# Patient Record
Sex: Female | Born: 1940 | Race: White | Hispanic: No | Smoking: Former smoker
Health system: Southern US, Community
[De-identification: ages and names within clinical notes are randomized; demographics above are authoritative.]

## PROBLEM LIST (undated history)

## (undated) DIAGNOSIS — H6690 Otitis media, unspecified, unspecified ear: Secondary | ICD-10-CM

## (undated) DIAGNOSIS — E079 Disorder of thyroid, unspecified: Secondary | ICD-10-CM

## (undated) DIAGNOSIS — M797 Fibromyalgia: Secondary | ICD-10-CM

## (undated) DIAGNOSIS — J42 Unspecified chronic bronchitis: Secondary | ICD-10-CM

## (undated) DIAGNOSIS — F32A Depression, unspecified: Secondary | ICD-10-CM

## (undated) DIAGNOSIS — W19XXXA Unspecified fall, initial encounter: Secondary | ICD-10-CM

## (undated) DIAGNOSIS — S6291XA Unspecified fracture of right wrist and hand, initial encounter for closed fracture: Secondary | ICD-10-CM

## (undated) DIAGNOSIS — I10 Essential (primary) hypertension: Secondary | ICD-10-CM

## (undated) DIAGNOSIS — F329 Major depressive disorder, single episode, unspecified: Secondary | ICD-10-CM

## (undated) DIAGNOSIS — J45909 Unspecified asthma, uncomplicated: Secondary | ICD-10-CM

## (undated) DIAGNOSIS — R55 Syncope and collapse: Secondary | ICD-10-CM

## (undated) DIAGNOSIS — S62309A Unspecified fracture of unspecified metacarpal bone, initial encounter for closed fracture: Secondary | ICD-10-CM

## (undated) DIAGNOSIS — R42 Dizziness and giddiness: Secondary | ICD-10-CM

## (undated) DIAGNOSIS — R32 Unspecified urinary incontinence: Secondary | ICD-10-CM

## (undated) DIAGNOSIS — M8580 Other specified disorders of bone density and structure, unspecified site: Secondary | ICD-10-CM

## (undated) DIAGNOSIS — R519 Headache, unspecified: Secondary | ICD-10-CM

## (undated) DIAGNOSIS — F419 Anxiety disorder, unspecified: Secondary | ICD-10-CM

## (undated) DIAGNOSIS — E7800 Pure hypercholesterolemia, unspecified: Secondary | ICD-10-CM

## (undated) DIAGNOSIS — R51 Headache: Secondary | ICD-10-CM

## (undated) DIAGNOSIS — K219 Gastro-esophageal reflux disease without esophagitis: Secondary | ICD-10-CM

## (undated) DIAGNOSIS — J189 Pneumonia, unspecified organism: Secondary | ICD-10-CM

## (undated) HISTORY — DX: Other specified disorders of bone density and structure, unspecified site: M85.80

## (undated) HISTORY — PX: BUNIONECTOMY WITH HAMMERTOE RECONSTRUCTION: SHX5600

## (undated) HISTORY — PX: SHOULDER OPEN ROTATOR CUFF REPAIR: SHX2407

## (undated) HISTORY — DX: Dizziness and giddiness: R42

## (undated) HISTORY — DX: Unspecified urinary incontinence: R32

## (undated) HISTORY — DX: Major depressive disorder, single episode, unspecified: F32.9

## (undated) HISTORY — DX: Unspecified fracture of right wrist and hand, initial encounter for closed fracture: S62.91XA

## (undated) HISTORY — PX: TONSILLECTOMY: SUR1361

## (undated) HISTORY — DX: Essential (primary) hypertension: I10

## (undated) HISTORY — DX: Depression, unspecified: F32.A

## (undated) HISTORY — DX: Unspecified fall, initial encounter: W19.XXXA

---

## 1997-07-26 ENCOUNTER — Other Ambulatory Visit: Admission: RE | Admit: 1997-07-26 | Discharge: 1997-07-26 | Payer: Self-pay | Admitting: *Deleted

## 1998-11-20 ENCOUNTER — Other Ambulatory Visit: Admission: RE | Admit: 1998-11-20 | Discharge: 1998-11-20 | Payer: Self-pay | Admitting: *Deleted

## 2000-03-16 ENCOUNTER — Other Ambulatory Visit: Admission: RE | Admit: 2000-03-16 | Discharge: 2000-03-16 | Payer: Self-pay | Admitting: *Deleted

## 2000-09-17 ENCOUNTER — Encounter: Payer: Self-pay | Admitting: Family Medicine

## 2000-09-17 ENCOUNTER — Encounter: Admission: RE | Admit: 2000-09-17 | Discharge: 2000-09-17 | Payer: Self-pay | Admitting: Family Medicine

## 2000-09-28 ENCOUNTER — Encounter: Payer: Self-pay | Admitting: Orthopedic Surgery

## 2000-09-28 ENCOUNTER — Encounter: Admission: RE | Admit: 2000-09-28 | Discharge: 2000-09-28 | Payer: Self-pay | Admitting: Orthopedic Surgery

## 2000-10-09 ENCOUNTER — Encounter: Payer: Self-pay | Admitting: Orthopedic Surgery

## 2000-10-09 ENCOUNTER — Encounter: Admission: RE | Admit: 2000-10-09 | Discharge: 2000-10-09 | Payer: Self-pay | Admitting: Orthopedic Surgery

## 2000-10-28 ENCOUNTER — Other Ambulatory Visit: Admission: RE | Admit: 2000-10-28 | Discharge: 2000-10-28 | Payer: Self-pay | Admitting: *Deleted

## 2001-02-12 ENCOUNTER — Ambulatory Visit (HOSPITAL_BASED_OUTPATIENT_CLINIC_OR_DEPARTMENT_OTHER): Admission: RE | Admit: 2001-02-12 | Discharge: 2001-02-13 | Payer: Self-pay | Admitting: Orthopedic Surgery

## 2003-08-16 ENCOUNTER — Encounter: Admission: RE | Admit: 2003-08-16 | Discharge: 2003-08-16 | Payer: Self-pay | Admitting: Otolaryngology

## 2004-01-05 ENCOUNTER — Emergency Department (HOSPITAL_COMMUNITY): Admission: EM | Admit: 2004-01-05 | Discharge: 2004-01-05 | Payer: Self-pay | Admitting: Emergency Medicine

## 2006-07-10 ENCOUNTER — Encounter: Admission: RE | Admit: 2006-07-10 | Discharge: 2006-07-10 | Payer: Self-pay | Admitting: Family Medicine

## 2007-11-10 ENCOUNTER — Other Ambulatory Visit: Admission: RE | Admit: 2007-11-10 | Discharge: 2007-11-10 | Payer: Self-pay | Admitting: Family Medicine

## 2010-05-31 NOTE — Op Note (Signed)
[place]. [facility]  Patient:    Carolyn Hart, Carolyn Hart Visit Number: [id number] MRN: [id number]          Service Type: DSU Location: [facility] Attending Physician:  Teena Dunk Dictated by:   Sharlot Gowda., M.D. Proc. Date: 02/12/01 Admit Date:  02/12/2001                             Operative Report  INDICATIONS:  The patient with an MRI suggesting a partial rotator cuff tear with significant pain and weakness thought to be amenable to a possible overnight hospitalization.  PREOPERATIVE DIAGNOSIS:  Partial rotator cuff tear.  POSTOPERATIVE DIAGNOSIS:  Massive rotator cuff tear with atrophy and retraction of supraspinatus, impingement, acromioclavicular arthritis, torn labrum.  OPERATION: 1. Open rotator cuff repair, acromioplasty. 2. Open distal clavicle excision. 3. Open transposition of superior two-thirds of subscapularis.  SURGEON:  Marcie Mowers, Montez Hageman., M.D.  ASSISTANT:  ______  PROCEDURE:  Arthroscope through a posterior and anterior portal.  It was obvious on ______ indicated on the MRI.  Certainly, it had been several months since the scan.  There was degenerative tearing of the labrum.  Biceps tendon ______ was intact.  This was debrided.  There were no severe degenerative changes of the humeral head or glenoid.  There appeared to be a good band of tissue with a large interstitial tear with a gap.  An important fact in this relatively young patient, it was thought to make every attempt to repair would be in order.  For this reason, the procedure after debridement of the shoulder was converted to an open procedure with an incision bisecting the acromion and the [facility] joint.  The [facility] joint was moderately degenerated.  It was excised over about a cm.  Certainly given the patients small size, the acromion was very thick, and probably a cm of anterior acromion was resected. The CA ligament was released.  Important fact:  What  appeared to be a tuft of tissue on the greater tuberosity was bursa, and the patient had a massive retracted tear basically to the level of the glenoid.  Again with extensive mobilization of the infraspinatus and the supraspinatus, it should be noted that the supraspinatus really was not viable.  It was just a tuft of tissue that could be brought slightly towards the lateral aspect of the arm, estimated to be probably 3-4 cm from its normal attachment.  Two sutures could be placed in the apex of the split, so to speak, between the infraspinatus and supraspinatus; however, due to that severe atrophy of the supraspinatus, it was necessary to transpose the upper two-thirds of the subscapularis right off the lesser tuberosity and somewhat superiorly to try at least to create a buffer between the humeral head and the acromion.  Essentially, at least the head at that point could not be visualized due to the closure of the infraspinatus to the supraspinatus remnant, as well as the subscapularis.  The deltoid was meticulously repaired with #1 Tycron, the subcutaneous tissues with 2-0 Vicryl, and the skin with Monocryl.  Marcaine was infiltrated in the wound, and a light, compressive sterile dressing was applied. Dictated by:   Sharlot Gowda., M.D. Attending Physician:  Teena Dunk DD:  02/12/01 TD:  02/12/01 Job: [id number]

## 2010-06-04 ENCOUNTER — Inpatient Hospital Stay (INDEPENDENT_AMBULATORY_CARE_PROVIDER_SITE_OTHER)
Admission: RE | Admit: 2010-06-04 | Discharge: 2010-06-04 | Disposition: A | Discharge status: Home / Self Care | Payer: Medicare Other | Source: Ambulatory Visit | Attending: Family Medicine | Admitting: Family Medicine

## 2010-06-04 DIAGNOSIS — F411 Generalized anxiety disorder: Secondary | ICD-10-CM

## 2010-06-04 LAB — POCT I-STAT, CHEM 8
BUN: 12 mg/dL (ref 6–23)
Calcium, Ion: 1.12 mmol/L (ref 1.12–1.32)
Chloride: 102 mEq/L (ref 96–112)
Creatinine, Ser: 0.8 mg/dL (ref 0.4–1.2)
Glucose, Bld: 100 mg/dL — ABNORMAL HIGH (ref 70–99)
HCT: 42 % (ref 36.0–46.0)
Hemoglobin: 14.3 g/dL (ref 12.0–15.0)
Potassium: 3.8 mEq/L (ref 3.5–5.1)
Sodium: 139 mEq/L (ref 135–145)
TCO2: 28 mmol/L (ref 0–100)

## 2012-07-12 ENCOUNTER — Encounter: Payer: Self-pay | Admitting: Obstetrics and Gynecology

## 2012-07-12 ENCOUNTER — Ambulatory Visit (INDEPENDENT_AMBULATORY_CARE_PROVIDER_SITE_OTHER): Payer: Medicare Other | Admitting: Obstetrics and Gynecology

## 2012-07-12 VITALS — BP 130/72 | HR 70 | Ht 59.0 in | Wt 135.0 lb

## 2012-07-12 DIAGNOSIS — M899 Disorder of bone, unspecified: Secondary | ICD-10-CM

## 2012-07-12 DIAGNOSIS — M8580 Other specified disorders of bone density and structure, unspecified site: Secondary | ICD-10-CM

## 2012-07-12 DIAGNOSIS — Z01419 Encounter for gynecological examination (general) (routine) without abnormal findings: Secondary | ICD-10-CM

## 2012-07-12 MED ORDER — NYSTATIN 100000 UNIT/GM EX POWD: Freq: Three times a day (TID) | CUTANEOUS | Status: DC

## 2012-07-12 NOTE — Progress Notes (Signed)
Patient ID: Carolyn Hart, female   DOB: Sep 10, 1940, 72 y.o.   MRN: [id number] 72 y.o.   Married    Caucasian   female   [id number]   here for annual exam.   Has problems with "heat rashes" when exercises in the summer. No bleeding in the menopause.   No vaginal dryness problems.   Some urinary incontinence but not often. Can happen when delays to empty.   Will schedule herself for mammogram.    Patient's last menstrual period was 01/13/2002.          Sexually active: no  The current method of family planning is post menopausal status.  HRT:  Took for 10 years.  Stopped 15 years ago.   Exercising: walking Last mammogram:  06/2011 wnl: [facility] Last pap smear: 2011 wnl History of abnormal pap: no Smoking: no Alcohol: 1-2 glasses of wine per week Last colonoscopy: 2012 benign polyps with Dr. [facility]) next colonoscopy due 2017 Last Bone Density:  2013 with Dr. Buren Kos: osteopenia Servings of calcium and vitamin D - 3 - 4 servings.   Last tetanus shot: with PCP Last cholesterol check: with PCP  Hgb: PCP               Urine: PCP   Family History  Problem Relation Age of Onset  . Heart disease Mother   . Heart failure Mother   . Heart disease Father   . Heart failure Father   . Heart disease Brother   . Heart failure Brother   . Heart failure Brother     There are no active problems to display for this patient.   Past Medical History  Diagnosis Date  . Depression   . Hypertension   . Osteopenia   . Urinary incontinence     Past Surgical History  Procedure Laterality Date  . Rotator cuff repair Right     Allergies: Penicillins; Tetracyclines & related; and Codeine  Current Outpatient Prescriptions  Medication Sig Dispense Refill  . Evening Primrose Oil 500 MG CAPS Take 1 capsule by mouth at bedtime as needed.      . fish oil-omega-3 fatty acids 1000 MG capsule Take 2 g by mouth daily.      . Multiple Vitamin (MULTIVITAMIN) capsule Take 1 capsule by mouth  daily.      . nortriptyline (PAMELOR) 10 MG capsule Take 10 mg by mouth at bedtime.      . CRESTOR 10 MG tablet Take 1 tablet by mouth daily.      . fluticasone (FLONASE) 50 MCG/ACT nasal spray 1 spray as needed.      Marland Kitchen losartan (COZAAR) 50 MG tablet Take 1 tablet by mouth daily.      Marland Kitchen PATADAY 0.2 % SOLN Place 1 drop into both eyes daily.      . sertraline (ZOLOFT) 100 MG tablet Take 1 tablet by mouth daily.      Marland Kitchen ZOSTAVAX 40981 UNT/0.65ML injection        No current facility-administered medications for this visit.    ROS: Pertinent items are noted in HPI.  Social Hx:  Married.  Retired Runner, broadcasting/film/video.  Real estate agent.  Exam:    BP 130/72  Pulse 70  Ht 4\' 11"  (1.499 m)  Wt 135 lb (61.236 kg)  BMI 27.25 kg/m2  LMP 01/13/2002   Wt Readings from Last 3 Encounters:  07/12/12 135 lb (61.236 kg)     Ht Readings from Last 3 Encounters:  07/12/12  4\' 11"  (1.499 m)    General appearance: alert, cooperative and appears stated age Head: Normocephalic, without obvious abnormality, atraumatic Neck: no adenopathy, supple, symmetrical, trachea midline and thyroid not enlarged, symmetric, no tenderness/mass/nodules Lungs: clear to auscultation bilaterally Breasts: Inspection negative, No nipple retraction or dimpling, No nipple discharge or bleeding, No axillary or supraclavicular adenopathy, Normal to palpation without dominant masses Heart: regular rate and rhythm Abdomen: soft, non-tender; no masses,  no organomegaly Extremities: extremities normal, atraumatic, no cyanosis or edema Skin: Skin color, texture, turgor normal. No rashes or lesions Lymph nodes: Cervical, supraclavicular, and axillary nodes normal. No abnormal inguinal nodes palpated Neurologic: Grossly normal   Pelvic: External genitalia:  no lesions              Urethra:  normal appearing urethra with no masses, tenderness or lesions              Bartholins and Skenes: normal                 Vagina: normal appearing  vagina with normal color and discharge, no lesions.  Vaginal tissue with petechiae when has contact with speculum.              Cervix: normal appearance              Pap taken: no        Bimanual Exam:  Uterus:  uterus is normal size, shape, consistency and nontender                                      Adnexa: normal adnexa in size, nontender and no masses                                      Rectovaginal: Confirms                                      Anus:  normal sphincter tone, no lesions  A: normal menopausal exam History of candida of skin. Osteopenia.     P: mammogram recommended.  Patient will schedule. pap smear not needed as per guidelines I discussed ways to reduce risk for osteoporosis.  Diet, exercise, avoid tobacco, moderate ETOH use.  Next bone density 2015. Nystatin powder.  See Epic orders. return annually or prn     An After Visit Summary was printed and given to the patient.

## 2012-07-12 NOTE — Patient Instructions (Addendum)
EXERCISE AND DIET:  We recommended that you start or continue a regular exercise program for good health. Regular exercise means any activity that makes your heart beat faster and makes you sweat.  We recommend exercising at least 30 minutes per day at least 3 days a week, preferably 4 or 5.  We also recommend a diet low in fat and sugar.  Inactivity, poor dietary choices and obesity can cause diabetes, heart attack, stroke, and kidney damage, among others.    ALCOHOL AND SMOKING:  Women should limit their alcohol intake to no more than 7 drinks/beers/glasses of wine (combined, not each!) per week. Moderation of alcohol intake to this level decreases your risk of breast cancer and liver damage. And of course, no recreational drugs are part of a healthy lifestyle.  And absolutely no smoking or even second hand smoke. Most people know smoking can cause heart and lung diseases, but did you know it also contributes to weakening of your bones? Aging of your skin?  Yellowing of your teeth and nails?  CALCIUM AND VITAMIN D:  Adequate intake of calcium and Vitamin D are recommended.  The recommendations for exact amounts of these supplements seem to change often, but generally speaking 600 mg of calcium (either carbonate or citrate) and 800 units of Vitamin D per day seems prudent. Certain women may benefit from higher intake of Vitamin D.  If you are among these women, your doctor will have told you during your visit.    PAP SMEARS:  Pap smears, to check for cervical cancer or precancers,  have traditionally been done yearly, although recent scientific advances have shown that most women can have pap smears less often.  However, every woman still should have a physical exam from her gynecologist every year. It will include a breast check, inspection of the vulva and vagina to check for abnormal growths or skin changes, a visual exam of the cervix, and then an exam to evaluate the size and shape of the uterus and  ovaries.  And after 72 years of age, a rectal exam is indicated to check for rectal cancers. We will also provide age appropriate advice regarding health maintenance, like when you should have certain vaccines, screening for sexually transmitted diseases, bone density testing, colonoscopy, mammograms, etc.   MAMMOGRAMS:  All women over 40 years old should have a yearly mammogram. Many facilities now offer a "3D" mammogram, which may cost around $50 extra out of pocket. If possible,  we recommend you accept the option to have the 3D mammogram performed.  It both reduces the number of women who will be called back for extra views which then turn out to be normal, and it is better than the routine mammogram at detecting truly abnormal areas.    COLONOSCOPY:  Colonoscopy to screen for colon cancer is recommended for all women at age 50.  We know, you hate the idea of the prep.  We agree, BUT, having colon cancer and not knowing it is worse!!  Colon cancer so often starts as a polyp that can be seen and removed at colonscopy, which can quite literally save your life!  And if your first colonoscopy is normal and you have no family history of colon cancer, most women don't have to have it again for 10 years.  Once every ten years, you can do something that may end up saving your life, right?  We will be happy to help you get it scheduled when you are ready.    Be sure to check your insurance coverage so you understand how much it will cost.  It may be covered as a preventative service at no cost, but you should check your particular policy.    Osteoporosis Throughout your life, your body breaks down old bone and replaces it with new bone. As you get older, your body does not replace bone as quickly as it breaks it down. By the age of 30 years, most people begin to gradually lose bone because of the imbalance between bone loss and replacement. Some people lose more bone than others. Bone loss beyond a specified normal  degree is considered osteoporosis.  Osteoporosis affects the strength and durability of your bones. The inside of the ends of your bones and your flat bones, like the bones of your pelvis, look like honeycomb, filled with tiny open spaces. As bone loss occurs, your bones become less dense. This means that the open spaces inside your bones become bigger and the walls between these spaces become thinner. This makes your bones weaker. Bones of a person with osteoporosis can become so weak that they can break (fracture) during minor accidents, such as a simple fall. CAUSES  The following factors have been associated with the development of osteoporosis:  Smoking.  Drinking more than 2 alcoholic drinks several days per week.  Long-term use of certain medicines:  Corticosteroids.  Chemotherapy medicines.  Thyroid medicines.  Antiepileptic medicines.  Gonadal hormone suppression medicine.  Immunosuppression medicine.  Being underweight.  Lack of physical activity.  Lack of exposure to the sun. This can lead to vitamin D deficiency.  Certain medical conditions:  Certain inflammatory bowel diseases, such as Crohn's disease and ulcerative colitis.  Diabetes.  Hyperthyroidism.  Hyperparathyroidism. RISK FACTORS Anyone can develop osteoporosis. However, the following factors can increase your risk of developing osteoporosis:  Gender Women are at higher risk than men.  Age Being older than 50 years increases your risk.  Ethnicity White and Asian people have an increased risk.  Weight Being extremely underweight can increase your risk of osteoporosis.  Family history of osteoporosis Having a family member who has developed osteoporosis can increase your risk. SYMPTOMS  Usually, people with osteoporosis have no symptoms.  DIAGNOSIS  Signs during a physical exam that may prompt your caregiver to suspect osteoporosis include:  Decreased height. This is usually caused by the  compression of the bones that form your spine (vertebrae) because they have weakened and become fractured.  A curving or rounding of the upper back (kyphosis). To confirm signs of osteoporosis, your caregiver may request a procedure that uses 2 low-dose X-ray beams with different levels of energy to measure your bone mineral density (dual-energy X-ray absorptiometry [DXA]). Also, your caregiver may check your level of vitamin D. TREATMENT  The goal of osteoporosis treatment is to strengthen bones in order to decrease the risk of bone fractures. There are different types of medicines available to help achieve this goal. Some of these medicines work by slowing the processes of bone loss. Some medicines work by increasing bone density. Treatment also involves making sure that your levels of calcium and vitamin D are adequate. PREVENTION  There are things you can do to help prevent osteoporosis. Adequate intake of calcium and vitamin D can help you achieve optimal bone mineral density. Regular exercise can also help, especially resistance and weight-bearing activities. If you smoke, quitting smoking is an important part of osteoporosis prevention. MAKE SURE YOU:  Understand these instructions.  Will watch  your condition.  Will get help right away if you are not doing well or get worse. Document Released: 10/09/2004 Document Revised: 12/17/2011 Document Reviewed: 12/14/2010 [facility] Patient Information 2014 [place], [place].

## 2012-10-03 ENCOUNTER — Encounter (HOSPITAL_COMMUNITY): Payer: Self-pay | Admitting: Emergency Medicine

## 2012-10-03 ENCOUNTER — Emergency Department (HOSPITAL_COMMUNITY)
Admission: EM | Admit: 2012-10-03 | Discharge: 2012-10-03 | Disposition: A | Discharge status: Home / Self Care | Payer: Medicare Other | Attending: Emergency Medicine | Admitting: Emergency Medicine

## 2012-10-03 ENCOUNTER — Emergency Department (HOSPITAL_COMMUNITY): Payer: Medicare Other

## 2012-10-03 DIAGNOSIS — Z79899 Other long term (current) drug therapy: Secondary | ICD-10-CM | POA: Insufficient documentation

## 2012-10-03 DIAGNOSIS — F3289 Other specified depressive episodes: Secondary | ICD-10-CM | POA: Insufficient documentation

## 2012-10-03 DIAGNOSIS — Z880 Allergy status to penicillin: Secondary | ICD-10-CM | POA: Insufficient documentation

## 2012-10-03 DIAGNOSIS — IMO0002 Reserved for concepts with insufficient information to code with codable children: Secondary | ICD-10-CM | POA: Insufficient documentation

## 2012-10-03 DIAGNOSIS — Z87891 Personal history of nicotine dependence: Secondary | ICD-10-CM | POA: Insufficient documentation

## 2012-10-03 DIAGNOSIS — W11XXXA Fall on and from ladder, initial encounter: Secondary | ICD-10-CM | POA: Insufficient documentation

## 2012-10-03 DIAGNOSIS — Y939 Activity, unspecified: Secondary | ICD-10-CM | POA: Insufficient documentation

## 2012-10-03 DIAGNOSIS — W19XXXA Unspecified fall, initial encounter: Secondary | ICD-10-CM

## 2012-10-03 DIAGNOSIS — I10 Essential (primary) hypertension: Secondary | ICD-10-CM | POA: Insufficient documentation

## 2012-10-03 DIAGNOSIS — Y92009 Unspecified place in unspecified non-institutional (private) residence as the place of occurrence of the external cause: Secondary | ICD-10-CM | POA: Insufficient documentation

## 2012-10-03 DIAGNOSIS — S0990XA Unspecified injury of head, initial encounter: Secondary | ICD-10-CM | POA: Insufficient documentation

## 2012-10-03 DIAGNOSIS — F329 Major depressive disorder, single episode, unspecified: Secondary | ICD-10-CM | POA: Insufficient documentation

## 2012-10-03 NOTE — ED Notes (Signed)
Pt ambulated to restroom specimen in room

## 2012-10-03 NOTE — ED Provider Notes (Signed)
CSN: [id number]     Arrival date & time 10/03/12  1302 History   First MD Initiated Contact with Patient 10/03/12 1334     Chief Complaint  Patient presents with  . Fall   (Consider location/radiation/quality/duration/timing/severity/associated sxs/prior Treatment) HPI Pt presents after fall from a 6 foot ladder.  Pt states she lost her footing and fell from ladder.  This occurred over an hour prior to arrival.  She has some soreness in her right shoulder and hip.  Struck the back of her head and has small area of swelling.  No neck or back pain.  Has not had any treatment prior to arrival.  No LOC, no seizure activity, no vomiting.  She is not taking blood thinners. Denies dizziness, palpitations, syncope to be the cause of the fall.  There are no other associated systemic symptoms, there are no other alleviating or modifying factors.   Past Medical History  Diagnosis Date  . Depression   . Hypertension   . Osteopenia   . Urinary incontinence    Past Surgical History  Procedure Laterality Date  . Rotator cuff repair Right    Family History  Problem Relation Age of Onset  . Heart disease Mother   . Heart failure Mother   . Heart disease Father   . Heart failure Father   . Heart disease Brother   . Heart failure Brother   . Heart failure Brother    History  Substance Use Topics  . Smoking status: Former Smoker -- 0.25 packs/day    Types: Cigarettes    Quit date: 01/14/1988  . Smokeless tobacco: Never Used  . Alcohol Use: Yes     Comment: 1-2 glasses of wine per week   OB History   Grav Para Term Preterm Abortions TAB SAB Ect Mult Living   1 1 1       1      Review of Systems ROS reviewed and all otherwise negative except for mentioned in HPI  Allergies  Penicillins; Tetracyclines & related; and Codeine  Home Medications   Current Outpatient Rx  Name  Route  Sig  Dispense  Refill  . CRESTOR 10 MG tablet   Oral   Take 1 tablet by mouth daily.         .  Evening Primrose Oil 500 MG CAPS   Oral   Take 1 capsule by mouth at bedtime as needed (hormones).          . fish oil-omega-3 fatty acids 1000 MG capsule   Oral   Take 2 g by mouth daily.         . fluticasone (FLONASE) 50 MCG/ACT nasal spray      1 spray as needed.         Marland Kitchen losartan (COZAAR) 50 MG tablet   Oral   Take 1 tablet by mouth daily.         . nortriptyline (PAMELOR) 10 MG capsule   Oral   Take 10 mg by mouth 3 times/day as needed-between meals & bedtime (for sleep).          . Propylene Glycol (SYSTANE BALANCE) 0.6 % SOLN   Both Eyes   Place 1 drop into both eyes as needed (dry eyes).         . sertraline (ZOLOFT) 100 MG tablet   Oral   Take 1 tablet by mouth daily.         . cefdinir (OMNICEF) 300 MG capsule  Oral   Take 300 mg by mouth 2 (two) times daily. 7 days         . Multiple Vitamin (MULTIVITAMIN) capsule   Oral   Take 1 capsule by mouth daily.          BP 131/62  Pulse 83  Temp(Src) 98 F (36.7 C) (Oral)  Resp 18  SpO2 100%  LMP 01/13/2002 Vitals reviewed Physical Exam Physical Examination: General appearance - alert, well appearing, and in no distress Mental status - alert, oriented to person, place, and time Eyes -PERRL, EOMI Head- small contusion to right posterior occiput, no abrasion or bleeding Mouth - mucous membranes moist, pharynx normal without lesions Neck - supple, no significant adenopathy, no midline tenderness to palpation Chest - clear to auscultation, no wheezes, rales or rhonchi, symmetric air entry Heart - normal rate, regular rhythm, normal S1, S2, no murmurs, rubs, clicks or gallops Abdomen - soft, nontender, nondistended, no masses or organomegaly Back exam - no ttp in midline of c/t/l spine, no paraspinal tenderness Neurological - alert, oriented, normal speech, strength 5/5 in extremities x 4, sensation intact Extremities - mild diffuse ttp over right shoulder and right hip- no bony point  tenderness and only mild pain with ROM of these joints.  peripheral pulses normal, no pedal edema, no clubbing or cyanosis Skin - normal coloration and turgor, no rashes  ED Course  Procedures (including critical care time) Labs Review Labs Reviewed - No data to display Imaging Review No results found.  MDM   1. Head injury, initial encounter   2. Fall, initial encounter    Pt presenting with fall from ladder and resultant head injury.  No neck or back pain, low suspicion for fracture of shoulder or hip based on exam today.      Ethelda Chick, MD 10/06/12 1001

## 2012-10-03 NOTE — ED Notes (Signed)
Pt reports falling from a 6 foot ladder in her home 1.5 hours ago. Pt reports falling on her buttocks and then the back of her head. Pt has a small, dime sized hematoma to the posterior of her head. Pt denies LOC, however reports dizziness. Pt denies being on anticoagulant therapy. Pt reports falling from the top of the ladder. Pt ambulatory to treatment room without complication. Pt is A/O x4 and in NAD.

## 2013-02-12 ENCOUNTER — Emergency Department (HOSPITAL_COMMUNITY)
Admission: EM | Admit: 2013-02-12 | Discharge: 2013-02-12 | Disposition: A | Discharge status: Home / Self Care | Payer: Medicare Other | Attending: Emergency Medicine | Admitting: Emergency Medicine

## 2013-02-12 ENCOUNTER — Encounter (HOSPITAL_COMMUNITY): Payer: Self-pay | Admitting: Emergency Medicine

## 2013-02-12 DIAGNOSIS — R112 Nausea with vomiting, unspecified: Secondary | ICD-10-CM

## 2013-02-12 DIAGNOSIS — Z880 Allergy status to penicillin: Secondary | ICD-10-CM | POA: Insufficient documentation

## 2013-02-12 DIAGNOSIS — R51 Headache: Secondary | ICD-10-CM | POA: Insufficient documentation

## 2013-02-12 DIAGNOSIS — F329 Major depressive disorder, single episode, unspecified: Secondary | ICD-10-CM | POA: Insufficient documentation

## 2013-02-12 DIAGNOSIS — Z87891 Personal history of nicotine dependence: Secondary | ICD-10-CM | POA: Insufficient documentation

## 2013-02-12 DIAGNOSIS — I10 Essential (primary) hypertension: Secondary | ICD-10-CM | POA: Insufficient documentation

## 2013-02-12 DIAGNOSIS — Z8739 Personal history of other diseases of the musculoskeletal system and connective tissue: Secondary | ICD-10-CM | POA: Insufficient documentation

## 2013-02-12 DIAGNOSIS — F3289 Other specified depressive episodes: Secondary | ICD-10-CM | POA: Insufficient documentation

## 2013-02-12 DIAGNOSIS — Z79899 Other long term (current) drug therapy: Secondary | ICD-10-CM | POA: Insufficient documentation

## 2013-02-12 DIAGNOSIS — J329 Chronic sinusitis, unspecified: Secondary | ICD-10-CM

## 2013-02-12 DIAGNOSIS — R42 Dizziness and giddiness: Secondary | ICD-10-CM

## 2013-02-12 LAB — POCT I-STAT, CHEM 8
BUN: 20 mg/dL (ref 6–23)
Calcium, Ion: 1.19 mmol/L (ref 1.13–1.30)
Chloride: 102 mEq/L (ref 96–112)
Creatinine, Ser: 0.6 mg/dL (ref 0.50–1.10)
Glucose, Bld: 137 mg/dL — ABNORMAL HIGH (ref 70–99)
HCT: 39 % (ref 36.0–46.0)
Hemoglobin: 13.3 g/dL (ref 12.0–15.0)
Potassium: 3.6 mEq/L — ABNORMAL LOW (ref 3.7–5.3)
Sodium: 140 mEq/L (ref 137–147)
TCO2: 25 mmol/L (ref 0–100)

## 2013-02-12 LAB — GLUCOSE, CAPILLARY: GLUCOSE-CAPILLARY: 138 mg/dL — AB (ref 70–99)

## 2013-02-12 MED ORDER — ONDANSETRON 8 MG PO TBDP: 8.0000 mg | ORAL_TABLET | Freq: Two times a day (BID) | ORAL | Status: DC | PRN

## 2013-02-12 MED ORDER — AZITHROMYCIN 250 MG PO TABS: ORAL_TABLET | ORAL | Status: DC

## 2013-02-12 MED ORDER — MECLIZINE HCL 25 MG PO TABS: 25.0000 mg | ORAL_TABLET | Freq: Three times a day (TID) | ORAL | Status: DC | PRN

## 2013-02-12 MED ORDER — SODIUM CHLORIDE 0.9 % IV BOLUS (SEPSIS)
500.0000 mL | Freq: Once | INTRAVENOUS | Status: AC
Start: 2013-02-12 — End: 2013-02-12
  Administered 2013-02-12: 500 mL via INTRAVENOUS

## 2013-02-12 MED ORDER — DIAZEPAM 5 MG PO TABS
10.0000 mg | ORAL_TABLET | Freq: Once | ORAL | Status: AC
  Administered 2013-02-12: 10 mg via ORAL
  Filled 2013-02-12: qty 2

## 2013-02-12 MED ORDER — ONDANSETRON HCL 4 MG/2ML IJ SOLN
4.0000 mg | Freq: Once | INTRAMUSCULAR | Status: AC
  Administered 2013-02-12: 4 mg via INTRAVENOUS
  Filled 2013-02-12: qty 2

## 2013-02-12 MED ORDER — DIAZEPAM 5 MG PO TABS: 5.0000 mg | ORAL_TABLET | Freq: Three times a day (TID) | ORAL | Status: DC | PRN

## 2013-02-12 MED ORDER — MECLIZINE HCL 25 MG PO TABS
25.0000 mg | ORAL_TABLET | Freq: Once | ORAL | Status: AC
  Administered 2013-02-12: 25 mg via ORAL
  Filled 2013-02-12: qty 1

## 2013-02-12 NOTE — ED Notes (Signed)
Pt requesting update on discharge status, MD notified.

## 2013-02-12 NOTE — ED Notes (Signed)
Pt requesting for IV to be removed, IV taken out as requested.

## 2013-02-12 NOTE — Discharge Instructions (Signed)
Sinusitis Sinusitis is redness, soreness, and swelling (inflammation) of the paranasal sinuses. Paranasal sinuses are air pockets within the bones of your face (beneath the eyes, the middle of the forehead, or above the eyes). In healthy paranasal sinuses, mucus is able to drain out, and air is able to circulate through them by way of your nose. However, when your paranasal sinuses are inflamed, mucus and air can become trapped. This can allow bacteria and other germs to grow and cause infection. Sinusitis can develop quickly and last only a short time (acute) or continue over a long period (chronic). Sinusitis that lasts for more than 12 weeks is considered chronic.  CAUSES  Causes of sinusitis include:  Allergies.  Structural abnormalities, such as displacement of the cartilage that separates your nostrils (deviated septum), which can decrease the air flow through your nose and sinuses and affect sinus drainage.  Functional abnormalities, such as when the small hairs (cilia) that line your sinuses and help remove mucus do not work properly or are not present. SYMPTOMS  Symptoms of acute and chronic sinusitis are the same. The primary symptoms are pain and pressure around the affected sinuses. Other symptoms include:  Upper toothache.  Earache.  Headache.  Bad breath.  Decreased sense of smell and taste.  A cough, which worsens when you are lying flat.  Fatigue.  Fever.  Thick drainage from your nose, which often is green and may contain pus (purulent).  Swelling and warmth over the affected sinuses. DIAGNOSIS  Your caregiver will perform a physical exam. During the exam, your caregiver may:  Look in your nose for signs of abnormal growths in your nostrils (nasal polyps).  Tap over the affected sinus to check for signs of infection.  View the inside of your sinuses (endoscopy) with a special imaging device with a light attached (endoscope), which is inserted into your  sinuses. If your caregiver suspects that you have chronic sinusitis, one or more of the following tests may be recommended:  Allergy tests.  Nasal culture A sample of mucus is taken from your nose and sent to a lab and screened for bacteria.  Nasal cytology A sample of mucus is taken from your nose and examined by your caregiver to determine if your sinusitis is related to an allergy. TREATMENT  Most cases of acute sinusitis are related to a viral infection and will resolve on their own within 10 days. Sometimes medicines are prescribed to help relieve symptoms (pain medicine, decongestants, nasal steroid sprays, or saline sprays).  However, for sinusitis related to a bacterial infection, your caregiver will prescribe antibiotic medicines. These are medicines that will help kill the bacteria causing the infection.  Rarely, sinusitis is caused by a fungal infection. In theses cases, your caregiver will prescribe antifungal medicine. For some cases of chronic sinusitis, surgery is needed. Generally, these are cases in which sinusitis recurs more than 3 times per year, despite other treatments. HOME CARE INSTRUCTIONS   Drink plenty of water. Water helps thin the mucus so your sinuses can drain more easily.  Use a humidifier.  Inhale steam 3 to 4 times a day (for example, sit in the bathroom with the shower running).  Apply a warm, moist washcloth to your face 3 to 4 times a day, or as directed by your caregiver.  Use saline nasal sprays to help moisten and clean your sinuses.  Take over-the-counter or prescription medicines for pain, discomfort, or fever only as directed by your caregiver. Carolyn Hart  CARE IF:  You have increasing pain or severe headaches.  You have nausea, vomiting, or drowsiness.  You have swelling around your face.  You have vision problems.  You have a stiff neck.  You have difficulty breathing. MAKE SURE YOU:   Understand these  instructions.  Will watch your condition.  Will get help right away if you are not doing well or get worse. Document Released: 12/30/2004 Document Revised: 03/24/2011 Document Reviewed: 01/14/2011 [facility] Patient Information 2014 [place], [place].     Vertigo Vertigo means you feel like you or your surroundings are moving when they are not. Vertigo can be dangerous if it occurs when you are at work, driving, or performing difficult activities.  CAUSES  Vertigo occurs when there is a conflict of signals sent to your brain from the visual and sensory systems in your body. There are many different causes of vertigo, including:  Infections, especially in the inner ear.  A bad reaction to a drug or misuse of alcohol and medicines.  Withdrawal from drugs or alcohol.  Rapidly changing positions, such as lying down or rolling over in bed.  A migraine headache.  Decreased blood flow to the brain.  Increased pressure in the brain from a head injury, infection, tumor, or bleeding. SYMPTOMS  You may feel as though the world is spinning around or you are falling to the ground. Because your balance is upset, vertigo can cause nausea and vomiting. You may have involuntary eye movements (nystagmus). DIAGNOSIS  Vertigo is usually diagnosed by physical exam. If the cause of your vertigo is unknown, your caregiver may perform imaging tests, such as an MRI scan (magnetic resonance imaging). TREATMENT  Most cases of vertigo resolve on their own, without treatment. Depending on the cause, your caregiver may prescribe certain medicines. If your vertigo is related to body position issues, your caregiver may recommend movements or procedures to correct the problem. In rare cases, if your vertigo is caused by certain inner ear problems, you may need surgery. HOME CARE INSTRUCTIONS   Follow your caregiver's instructions.  Avoid driving.  Avoid operating heavy machinery.  Avoid performing any tasks  that would be dangerous to you or others during a vertigo episode.  Tell your caregiver if you notice that certain medicines seem to be causing your vertigo. Some of the medicines used to treat vertigo episodes can actually make them worse in some people. SEEK IMMEDIATE MEDICAL CARE IF:   Your medicines do not relieve your vertigo or are making it worse.  You develop problems with talking, walking, weakness, or using your arms, hands, or legs.  You develop severe headaches.  Your nausea or vomiting continues or gets worse.  You develop visual changes.  A family member notices behavioral changes.  Your condition gets worse. MAKE SURE YOU:  Understand these instructions.  Will watch your condition.  Will get help right away if you are not doing well or get worse. Document Released: 10/09/2004 Document Revised: 03/24/2011 Document Reviewed: 07/18/2010 [facility] Patient Information 2014 [place], [place].    Narcotic and benzodiazepine use may cause drowsiness, slowed breathing or dependence.  Please use with caution and do not drive, operate machinery or watch young children alone while taking them.  Taking combinations of these medications or drinking alcohol will potentiate these effects.

## 2013-02-12 NOTE — ED Notes (Signed)
Family at pt reports that pt went to bed last night with a frontal headache. Pt woke up at 0130 and could not focus her eyes. The room started spinning, then pt felt nausea with dry heaves. Pt denies being around others with illness. Pt last seen normal by family yesterday. Speech clear, grips equal, no facial droop.

## 2013-02-12 NOTE — ED Provider Notes (Signed)
CSN: [id number]     Arrival date & time 02/12/13  1191 History   First MD Initiated Contact with Patient 02/12/13 0818     Chief Complaint  Patient presents with  . Nausea  . Emesis  . Dizziness   (Consider location/radiation/quality/duration/timing/severity/associated sxs/prior Treatment) HPI Comments: Pt endorses head congestion and sinus congestion for several days, no fevers.  No prior h/o vertigo in the past.    Patient is a 73 y.o. female presenting with vomiting and dizziness. The history is provided by the patient and a relative.  Emesis Severity:  Moderate Duration:  3 hours Timing:  Constant Progression:  Unchanged Chronicity:  New Context: not post-tussive and not self-induced   Associated symptoms: headaches and URI   Associated symptoms: no abdominal pain, no chills and no fever   Dizziness Quality:  Room spinning, head spinning and vertigo Severity:  Moderate Onset quality:  Sudden Duration:  3 hours Timing:  Constant Progression:  Partially resolved Chronicity:  New Context: head movement   Context: not with loss of consciousness   Relieved by:  Lying down Worsened by:  Movement, sitting upright, turning head and standing up Associated symptoms: headaches, nausea and vomiting   Associated symptoms: no chest pain, no shortness of breath and no vision changes     Past Medical History  Diagnosis Date  . Depression   . Hypertension   . Osteopenia   . Urinary incontinence    Past Surgical History  Procedure Laterality Date  . Rotator cuff repair Right    Family History  Problem Relation Age of Onset  . Heart disease Mother   . Heart failure Mother   . Heart disease Father   . Heart failure Father   . Heart disease Brother   . Heart failure Brother   . Heart failure Brother    History  Substance Use Topics  . Smoking status: Former Smoker -- 0.25 packs/day    Types: Cigarettes    Quit date: 01/13/1973  . Smokeless tobacco: Never Used  .  Alcohol Use: Yes   OB History   Grav Para Term Preterm Abortions TAB SAB Ect Mult Living   1 1 1       1      Review of Systems  Constitutional: Negative for fever and chills.  HENT: Positive for congestion and sinus pressure.   Respiratory: Negative for cough and shortness of breath.   Cardiovascular: Negative for chest pain.  Gastrointestinal: Positive for nausea and vomiting. Negative for abdominal pain.  Neurological: Positive for dizziness and headaches.  All other systems reviewed and are negative.    Allergies  Penicillins; Tetracyclines & related; and Codeine  Home Medications   Current Outpatient Rx  Name  Route  Sig  Dispense  Refill  . CRESTOR 10 MG tablet   Oral   Take 5 mg by mouth daily.          . fluticasone (FLONASE) 50 MCG/ACT nasal spray   Each Nare   Place 1 spray into both nostrils as needed for allergies.          Marland Kitchen losartan (COZAAR) 50 MG tablet   Oral   Take 1 tablet by mouth daily.         . Multiple Vitamin (MULTIVITAMIN) capsule   Oral   Take 1 capsule by mouth daily.         Marland Kitchen Propylene Glycol (SYSTANE BALANCE) 0.6 % SOLN   Both Eyes   Place 1 drop  into both eyes as needed (dry eyes).         . pseudoephedrine-acetaminophen (TYLENOL SINUS) 30-500 MG TABS   Oral   Take 2 tablets by mouth every 6 (six) hours as needed (headache/sinus blockage).         . sertraline (ZOLOFT) 100 MG tablet   Oral   Take 1 tablet by mouth daily.         . diazepam (VALIUM) 5 MG tablet   Oral   Take 1 tablet (5 mg total) by mouth every 8 (eight) hours as needed for sedation (or severe dizziness).   15 tablet   0   . meclizine (ANTIVERT) 25 MG tablet   Oral   Take 1 tablet (25 mg total) by mouth 3 (three) times daily as needed for dizziness.   20 tablet   0   . nortriptyline (PAMELOR) 10 MG capsule   Oral   Take 10 mg by mouth 3 times/day as needed-between meals & bedtime (for sleep).         . ondansetron (ZOFRAN-ODT) 8 MG  disintegrating tablet   Oral   Take 1 tablet (8 mg total) by mouth every 12 (twelve) hours as needed for nausea.   20 tablet   0    BP 122/69  Pulse 78  Temp(Src) 97.6 F (36.4 C) (Oral)  Resp 20  Ht 5' (1.524 m)  Wt 123 lb (55.792 kg)  BMI 24.02 kg/m2  SpO2 96%  LMP 01/13/2002 Physical Exam  Nursing note and vitals reviewed. Constitutional: She is oriented to person, place, and time. She appears well-developed and well-nourished. No distress.  HENT:  Head: Normocephalic and atraumatic.  Right Ear: Tympanic membrane and ear canal normal.  Left Ear: Tympanic membrane and ear canal normal.  Nose: Sinus tenderness present. Right sinus exhibits maxillary sinus tenderness. Left sinus exhibits maxillary sinus tenderness.  Eyes: Conjunctivae are normal. Right eye exhibits nystagmus. Left eye exhibits nystagmus.  Horizontal nystagmus with far gaze to patient's left  Neck: Normal range of motion. Neck supple.  No tenderness,   Cardiovascular: Normal rate, regular rhythm and intact distal pulses.   No murmur heard. Pulmonary/Chest: Effort normal. No respiratory distress.  Abdominal: Soft. She exhibits no distension. There is no tenderness.  Neurological: She is alert and oriented to person, place, and time. No cranial nerve deficit. She exhibits normal muscle tone. Coordination normal.  Skin: Skin is warm and dry. She is not diaphoretic.    ED Course  Procedures (including critical care time) Labs Review Labs Reviewed  GLUCOSE, CAPILLARY - Abnormal; Notable for the following:    Glucose-Capillary 138 (*)    All other components within normal limits  POCT I-STAT, CHEM 8 - Abnormal; Notable for the following:    Potassium 3.6 (*)    Glucose, Bld 137 (*)    All other components within normal limits   Imaging Review No results found.  EKG Interpretation    Date/Time:  Saturday February 12 2013 07:56:57 EST Ventricular Rate:  79 PR Interval:  132 QRS Duration: 80 QT  Interval:  386 QTC Calculation: 442 R Axis:   55 Text Interpretation:  Normal sinus rhythm Normal ECG Normal ECG No previous tracing Confirmed by [facility]  MD, MICHEAL ([id number]) on 02/12/2013 12:03:17 PM           RA sat is 96% and I interpret to be adequate  MDM   1. Vertigo   2. Sinusitis   3. Nausea and vomiting  Pt with acute vertigo episode based on history and exam with nystagmus, sudden onset.  Pt with URI and sinus infection symptoms which may be trigger.  Pt improved here with Zofran, IVF and antivert.  With trying to stand and leave, symptoms came back somewhat, will give some valium as well.  Pt can follow up with PCP next week for continued symptoms.      Gavin PoundMichael Y. Courage Biglow, MD 02/12/13 [phone]

## 2013-07-04 ENCOUNTER — Encounter: Payer: Self-pay | Admitting: Obstetrics and Gynecology

## 2013-07-18 ENCOUNTER — Ambulatory Visit: Payer: Medicare Other | Admitting: Obstetrics and Gynecology

## 2013-07-21 ENCOUNTER — Ambulatory Visit: Payer: Medicare Other | Admitting: Gynecology

## 2013-08-17 ENCOUNTER — Ambulatory Visit (INDEPENDENT_AMBULATORY_CARE_PROVIDER_SITE_OTHER): Payer: Medicare Other | Admitting: Obstetrics and Gynecology

## 2013-08-17 ENCOUNTER — Encounter: Payer: Self-pay | Admitting: Obstetrics and Gynecology

## 2013-08-17 VITALS — BP 120/70 | HR 70 | Resp 14 | Ht 59.0 in | Wt 130.8 lb

## 2013-08-17 DIAGNOSIS — Z01419 Encounter for gynecological examination (general) (routine) without abnormal findings: Secondary | ICD-10-CM

## 2013-08-17 NOTE — Patient Instructions (Signed)

## 2013-08-17 NOTE — Progress Notes (Signed)
Patient ID: Carolyn Hart, female   DOB: 03/17/1940, 73 y.o.   MRN: [id number] GYNECOLOGY VISIT  PCP: Buren Kosouglas Shaw, MD  Referring provider:   HPI: 73 y.o.   Married  Caucasian  female   G1P1001 with Patient's last menstrual period was 01/13/2002.   here for   AEX.  Had episode of vertigo.   Spends time with her 4 grandchildren.  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  Is a former Editor, commissioningrench teacher.   Hgb:  PCP Urine: PCP  GYNECOLOGIC HISTORY: Patient's last menstrual period was 01/13/2002. Sexually active:  no Partner preference: female Contraception:  postmenopausal  Menopausal hormone therapy: no DES exposure:   no Blood transfusions:   no Sexually transmitted diseases:   no GYN procedures and prior surgeries:  none Last mammogram:   07/2013 XBJ:[facility]l:[facility]          Last pap and high risk HPV testing:  2011 wnl    History of abnormal pap smear:  no   OB History   Grav Para Term Preterm Abortions TAB SAB Ect Mult Living   1 1 1       1        LIFESTYLE: Exercise:    Walking and treadmill        Tobacco:    Former smoker Alcohol:     2 glasses of wine per week Drug use:  no  OTHER HEALTH MAINTENANCE: Tetanus/TDap:  Up to date with PCP Gardisil:             n/a Influenza:          10/2012 Zostavax:          Completed with PCP  Bone density:    06/2013 normal with Dr. Clelia CroftShaw Colonoscopy:    2012 had polyps at Dr. Luan MooreGanem's office [facility]  Next colonoscopy due 2017.  Cholesterol check: normal 06/2013 with PCP  Family History  Problem Relation Age of Onset  . Heart disease Mother   . Heart failure Mother   . Heart disease Father   . Heart failure Father   . Heart disease Brother   . Heart failure Brother   . Heart failure Brother     There are no active problems to display for this patient.  Past Medical History  Diagnosis Date  . Depression   . Hypertension   . Osteopenia   . Urinary incontinence   . Vertigo     Past Surgical History   Procedure Laterality Date  . Rotator cuff repair Right     ALLERGIES: Penicillins; Tetracyclines & related; and Codeine  Current Outpatient Prescriptions  Medication Sig Dispense Refill  . cetirizine (ZYRTEC) 10 MG tablet Take 10 mg by mouth daily.      Marland Kitchen. losartan (COZAAR) 25 MG tablet Take 25 mg by mouth daily.      . Multiple Vitamin (MULTIVITAMIN) tablet Take 1 tablet by mouth daily.      Marland Kitchen. Propylene Glycol (SYSTANE BALANCE) 0.6 % SOLN Apply 1 drop to eye daily.      . rosuvastatin (CRESTOR) 10 MG tablet Take 10 mg by mouth daily.      . sertraline (ZOLOFT) 100 MG tablet Take 100 mg by mouth daily.       No current facility-administered medications for this visit.     ROS:  Pertinent items are noted in HPI.  SOCIAL HISTORY:  Married.   PHYSICAL EXAMINATION:    BP 120/70  Pulse 70  Resp 14  Ht 4\' 11"  (1.499 m)  Wt 130 lb 12.8 oz (59.33 kg)  BMI 26.40 kg/m2  LMP 01/13/2002   Wt Readings from Last 3 Encounters:  08/17/13 130 lb 12.8 oz (59.33 kg)  02/12/13 123 lb (55.792 kg)  07/12/12 135 lb (61.236 kg)     Ht Readings from Last 3 Encounters:  08/17/13 4\' 11"  (1.499 m)  02/12/13 5' (1.524 m)  07/12/12 4\' 11"  (1.499 m)    General appearance: alert, cooperative and appears stated age Head: Normocephalic, without obvious abnormality, atraumatic Neck: no adenopathy, supple, symmetrical, trachea midline and thyroid not enlarged, symmetric, no tenderness/mass/nodules Lungs: clear to auscultation bilaterally Breasts: Inspection negative, No nipple retraction or dimpling, No nipple discharge or bleeding, No axillary or supraclavicular adenopathy, Normal to palpation without dominant masses Heart: regular rate and rhythm Abdomen: soft, non-tender; no masses,  no organomegaly Extremities: extremities normal, atraumatic, no cyanosis or edema Skin: Skin color, texture, turgor normal. No rashes or lesions Lymph nodes: Cervical, supraclavicular, and axillary nodes normal. No  abnormal inguinal nodes palpated Neurologic: Grossly normal  Pelvic: External genitalia:  no lesions              Urethra:  normal appearing urethra with no masses, tenderness or lesions              Bartholins and Skenes: normal                 Vagina: normal appearing vagina with normal color and discharge, no lesions              Cervix: normal appearance              Pap and high risk HPV testing done: No..            Bimanual Exam:  Uterus:  uterus is normal size, shape, consistency and nontender                                      Adnexa: normal adnexa in size, nontender and no masses                                      Rectovaginal: Confirms                                      Anus:  normal sphincter tone, no lesions  ASSESSMENT  Normal gynecologic exam. Osteopenia resolved per patient.   PLAN  Mammogram recommended yearly.  Pap smear and high risk HPV testing not indicated.  Counseled on self breast exam, Calcium and vitamin D intake, exercise. Will get mammogram and bone density reports. Return annually or prn   An After Visit Summary was printed and given to the patient.

## 2013-11-14 ENCOUNTER — Encounter: Payer: Self-pay | Admitting: Obstetrics and Gynecology

## 2014-08-30 ENCOUNTER — Encounter: Payer: Self-pay | Admitting: Obstetrics and Gynecology

## 2014-08-30 ENCOUNTER — Ambulatory Visit (INDEPENDENT_AMBULATORY_CARE_PROVIDER_SITE_OTHER): Payer: Medicare Other | Admitting: Obstetrics and Gynecology

## 2014-08-30 VITALS — BP 120/62 | HR 88 | Resp 16 | Ht 58.75 in | Wt 133.0 lb

## 2014-08-30 DIAGNOSIS — Z0000 Encounter for general adult medical examination without abnormal findings: Secondary | ICD-10-CM

## 2014-08-30 DIAGNOSIS — Z01419 Encounter for gynecological examination (general) (routine) without abnormal findings: Secondary | ICD-10-CM | POA: Diagnosis not present

## 2014-08-30 LAB — POCT URINALYSIS DIPSTICK
BILIRUBIN UA: NEGATIVE
Glucose, UA: NEGATIVE
Ketones, UA: NEGATIVE
Leukocytes, UA: NEGATIVE
NITRITE UA: NEGATIVE
PH UA: 6
PROTEIN UA: NEGATIVE
RBC UA: NEGATIVE
UROBILINOGEN UA: NEGATIVE

## 2014-08-30 NOTE — Patient Instructions (Signed)

## 2014-08-30 NOTE — Progress Notes (Signed)
74 y.o. G32P1001 Married Caucasian female here for annual exam.    Treating for bronchitis.  On Levaquin.   Husband with health issues - aortic aneurysm surgery, dementia, bipolar. Moving into a new home in [place] and will be living with family. Wants to build [place] on the property.  Works in Research officer, political party.  PCP:   Martha Clan  Patient's last menstrual period was 01/13/2002.          Sexually active: No.  The current method of family planning is post menopausal status.    Exercising: Yes.    Walking Smoker:  no  Health Maintenance: Pap:  2011 Normal History of abnormal Pap:  no MMG:  08/11/13 BIRADS1:neg.  Patient will schedule with [facility].  Colonoscopy:  2012 Polyps - every 5 years/ Dr. Bing Matter GI BMD:   06/2013  Result:  Normal  TDaP:  UPT - PCP Screening Labs:   PCP, Urine today: Clear   reports that she quit smoking about 41 years ago. Her smoking use included Cigarettes. She smoked 0.25 packs per day. She has never used smokeless tobacco. She reports that she drinks about 2.4 oz of alcohol per week. She reports that she does not use illicit drugs.  Past Medical History  Diagnosis Date  . Depression   . Hypertension   . Osteopenia   . Urinary incontinence   . Vertigo     Past Surgical History  Procedure Laterality Date  . Rotator cuff repair Right     Current Outpatient Prescriptions  Medication Sig Dispense Refill  . benzonatate (TESSALON) 100 MG capsule take 1 to 2 capsules by mouth every 8 hours if needed for cough  0  . cetirizine (ZYRTEC) 10 MG tablet Take 10 mg by mouth daily.    . cholecalciferol (VITAMIN D) 1000 UNITS tablet Take 1,000 Units by mouth daily.    Marland Kitchen levofloxacin (LEVAQUIN) 500 MG tablet take 1 tablet by mouth once daily with food  0  . losartan (COZAAR) 25 MG tablet Take 12.5 mg by mouth daily.     . Multiple Vitamin (MULTIVITAMIN) tablet Take 1 tablet by mouth daily.    Marland Kitchen Propylene Glycol (SYSTANE BALANCE) 0.6 % SOLN Apply 1  drop to eye daily.    . rosuvastatin (CRESTOR) 10 MG tablet Take 10 mg by mouth daily.    . sertraline (ZOLOFT) 100 MG tablet Take 100 mg by mouth daily.     No current facility-administered medications for this visit.    Family History  Problem Relation Age of Onset  . Heart disease Mother   . Heart failure Mother   . Heart disease Father   . Heart failure Father   . Heart disease Brother   . Heart failure Brother   . Heart failure Brother     ROS:  Pertinent items are noted in HPI.  Otherwise, a comprehensive ROS was negative.  Exam:   BP 120/62 mmHg  Pulse 88  Resp 16  Ht 4' 10.75" (1.492 m)  Wt 133 lb (60.328 kg)  BMI 27.10 kg/m2  LMP 01/13/2002    General appearance: alert, cooperative and appears stated age Head: Normocephalic, without obvious abnormality, atraumatic Neck: no adenopathy, supple, symmetrical, trachea midline and thyroid normal to inspection and palpation Lungs: clear to auscultation bilaterally Breasts: normal appearance, no masses or tenderness, Inspection negative, No nipple retraction or dimpling, No nipple discharge or bleeding, No axillary or supraclavicular adenopathy Heart: regular rate and rhythm Abdomen: soft, non-tender; bowel  sounds normal; no masses,  no organomegaly Extremities: extremities normal, atraumatic, no cyanosis or edema Skin: Skin color, texture, turgor normal. No rashes or lesions Lymph nodes: Cervical, supraclavicular, and axillary nodes normal. No abnormal inguinal nodes palpated Neurologic: Grossly normal  Pelvic: External genitalia:  no lesions              Urethra:  normal appearing urethra with no masses, tenderness or lesions              Bartholins and Skenes: normal                 Vagina: normal appearing vagina with normal color and discharge, no lesions              Cervix: no lesions              Pap taken: Yes.   Bimanual Exam:  Uterus:  normal size, contour, position, consistency, mobility, non-tender               Adnexa: normal adnexa and no mass, fullness, tenderness              Rectovaginal: Yes.  .  Confirms.              Anus:  normal sphincter tone, no lesions  Chaperone was present for exam.  Assessment:   Well woman visit with normal exam. Bronchitis.  Under treatment.   Plan: Yearly mammogram recommended after age 61.   Patient will call to schedule.  Recommended self breast exam.  Pap and HR HPV as above. Discussed Calcium, Vitamin D, regular exercise program including cardiovascular and weight bearing exercise. Labs performed.  No..     Refills given on medications.  No..    See PCP if cough symptoms do not resolve. Follow up annually and prn.      After visit summary provided.

## 2014-09-01 LAB — IPS PAP SMEAR ONLY

## 2014-09-06 ENCOUNTER — Other Ambulatory Visit: Payer: Self-pay | Admitting: Internal Medicine

## 2014-09-06 DIAGNOSIS — J329 Chronic sinusitis, unspecified: Secondary | ICD-10-CM

## 2014-09-08 ENCOUNTER — Ambulatory Visit
Admission: RE | Admit: 2014-09-08 | Discharge: 2014-09-08 | Disposition: A | Discharge status: Home / Self Care | Payer: Medicare Other | Source: Ambulatory Visit | Attending: Internal Medicine | Admitting: Internal Medicine

## 2014-09-08 DIAGNOSIS — J329 Chronic sinusitis, unspecified: Secondary | ICD-10-CM

## 2015-03-14 DIAGNOSIS — S6291XA Unspecified fracture of right wrist and hand, initial encounter for closed fracture: Secondary | ICD-10-CM

## 2015-03-14 HISTORY — DX: Unspecified fracture of right wrist and hand, initial encounter for closed fracture: S62.91XA

## 2015-04-09 ENCOUNTER — Inpatient Hospital Stay (HOSPITAL_COMMUNITY)
Admission: EM | Admit: 2015-04-09 | Discharge: 2015-04-11 | DRG: 312 | Disposition: A | Discharge status: Home / Self Care | Payer: Medicare Other | Attending: Internal Medicine | Admitting: Internal Medicine

## 2015-04-09 ENCOUNTER — Encounter (HOSPITAL_COMMUNITY): Payer: Self-pay | Admitting: Emergency Medicine

## 2015-04-09 ENCOUNTER — Emergency Department (HOSPITAL_COMMUNITY): Payer: Medicare Other

## 2015-04-09 ENCOUNTER — Observation Stay (HOSPITAL_COMMUNITY): Payer: Medicare Other

## 2015-04-09 DIAGNOSIS — E785 Hyperlipidemia, unspecified: Secondary | ICD-10-CM | POA: Insufficient documentation

## 2015-04-09 DIAGNOSIS — S62306A Unspecified fracture of fifth metacarpal bone, right hand, initial encounter for closed fracture: Secondary | ICD-10-CM | POA: Diagnosis present

## 2015-04-09 DIAGNOSIS — R55 Syncope and collapse: Principal | ICD-10-CM

## 2015-04-09 DIAGNOSIS — J329 Chronic sinusitis, unspecified: Secondary | ICD-10-CM | POA: Diagnosis present

## 2015-04-09 DIAGNOSIS — S62308A Unspecified fracture of other metacarpal bone, initial encounter for closed fracture: Secondary | ICD-10-CM | POA: Insufficient documentation

## 2015-04-09 DIAGNOSIS — I639 Cerebral infarction, unspecified: Secondary | ICD-10-CM

## 2015-04-09 DIAGNOSIS — E7800 Pure hypercholesterolemia, unspecified: Secondary | ICD-10-CM | POA: Diagnosis present

## 2015-04-09 DIAGNOSIS — R569 Unspecified convulsions: Secondary | ICD-10-CM | POA: Diagnosis present

## 2015-04-09 DIAGNOSIS — F329 Major depressive disorder, single episode, unspecified: Secondary | ICD-10-CM | POA: Diagnosis present

## 2015-04-09 DIAGNOSIS — M797 Fibromyalgia: Secondary | ICD-10-CM | POA: Diagnosis present

## 2015-04-09 DIAGNOSIS — R42 Dizziness and giddiness: Secondary | ICD-10-CM

## 2015-04-09 DIAGNOSIS — Z87891 Personal history of nicotine dependence: Secondary | ICD-10-CM

## 2015-04-09 DIAGNOSIS — Y92481 Parking lot as the place of occurrence of the external cause: Secondary | ICD-10-CM

## 2015-04-09 DIAGNOSIS — Z79899 Other long term (current) drug therapy: Secondary | ICD-10-CM

## 2015-04-09 DIAGNOSIS — I10 Essential (primary) hypertension: Secondary | ICD-10-CM | POA: Diagnosis not present

## 2015-04-09 DIAGNOSIS — W19XXXA Unspecified fall, initial encounter: Secondary | ICD-10-CM | POA: Diagnosis not present

## 2015-04-09 DIAGNOSIS — F32A Depression, unspecified: Secondary | ICD-10-CM

## 2015-04-09 DIAGNOSIS — S0003XA Contusion of scalp, initial encounter: Secondary | ICD-10-CM | POA: Diagnosis present

## 2015-04-09 DIAGNOSIS — H6690 Otitis media, unspecified, unspecified ear: Secondary | ICD-10-CM

## 2015-04-09 DIAGNOSIS — E860 Dehydration: Secondary | ICD-10-CM | POA: Diagnosis present

## 2015-04-09 DIAGNOSIS — S62309A Unspecified fracture of unspecified metacarpal bone, initial encounter for closed fracture: Secondary | ICD-10-CM | POA: Diagnosis present

## 2015-04-09 HISTORY — DX: Pure hypercholesterolemia, unspecified: E78.00

## 2015-04-09 HISTORY — DX: Pneumonia, unspecified organism: J18.9

## 2015-04-09 HISTORY — DX: Unspecified chronic bronchitis: J42

## 2015-04-09 HISTORY — DX: Headache: R51

## 2015-04-09 HISTORY — DX: Fibromyalgia: M79.7

## 2015-04-09 HISTORY — DX: Syncope and collapse: R55

## 2015-04-09 HISTORY — DX: Anxiety disorder, unspecified: F41.9

## 2015-04-09 HISTORY — DX: Unspecified fracture of unspecified metacarpal bone, initial encounter for closed fracture: S62.309A

## 2015-04-09 HISTORY — DX: Unspecified fall, initial encounter: W19.XXXA

## 2015-04-09 HISTORY — DX: Headache, unspecified: R51.9

## 2015-04-09 HISTORY — DX: Otitis media, unspecified, unspecified ear: H66.90

## 2015-04-09 LAB — CBC WITH DIFFERENTIAL/PLATELET
Basophils Absolute: 0 K/uL (ref 0.0–0.1)
Basophils Relative: 0 %
Eosinophils Absolute: 0.1 K/uL (ref 0.0–0.7)
Eosinophils Relative: 1 %
HCT: 42.6 % (ref 36.0–46.0)
Hemoglobin: 14.3 g/dL (ref 12.0–15.0)
Lymphocytes Relative: 21 %
Lymphs Abs: 2.1 K/uL (ref 0.7–4.0)
MCH: 29.4 pg (ref 26.0–34.0)
MCHC: 33.6 g/dL (ref 30.0–36.0)
MCV: 87.5 fL (ref 78.0–100.0)
Monocytes Absolute: 0.5 K/uL (ref 0.1–1.0)
Monocytes Relative: 5 %
Neutro Abs: 7.2 K/uL (ref 1.7–7.7)
Neutrophils Relative %: 73 %
Platelets: 232 K/uL (ref 150–400)
RBC: 4.87 MIL/uL (ref 3.87–5.11)
RDW: 13.2 % (ref 11.5–15.5)
WBC: 9.9 K/uL (ref 4.0–10.5)

## 2015-04-09 LAB — URINALYSIS, ROUTINE W REFLEX MICROSCOPIC
Bilirubin Urine: NEGATIVE
GLUCOSE, UA: NEGATIVE mg/dL
HGB URINE DIPSTICK: NEGATIVE
Ketones, ur: NEGATIVE mg/dL
LEUKOCYTES UA: NEGATIVE
Nitrite: NEGATIVE
PROTEIN: NEGATIVE mg/dL
SPECIFIC GRAVITY, URINE: 1.018 (ref 1.005–1.030)
pH: 7 (ref 5.0–8.0)

## 2015-04-09 LAB — I-STAT TROPONIN, ED: Troponin i, poc: 0 ng/mL (ref 0.00–0.08)

## 2015-04-09 LAB — BASIC METABOLIC PANEL
ANION GAP: 11 (ref 5–15)
BUN: 12 mg/dL (ref 6–20)
CALCIUM: 9.4 mg/dL (ref 8.9–10.3)
CHLORIDE: 105 mmol/L (ref 101–111)
CO2: 24 mmol/L (ref 22–32)
Creatinine, Ser: 0.64 mg/dL (ref 0.44–1.00)
GFR calc non Af Amer: >60 mL/min (ref >60)
GLUCOSE: 130 mg/dL — AB (ref 65–99)
Potassium: 4 mmol/L (ref 3.5–5.1)
Sodium: 140 mmol/L (ref 135–145)

## 2015-04-09 LAB — TROPONIN I

## 2015-04-09 LAB — TSH: TSH: 1.53 u[IU]/mL (ref 0.350–4.500)

## 2015-04-09 MED ORDER — TRAZODONE HCL 50 MG PO TABS: 25.0000 mg | ORAL_TABLET | Freq: Every evening | ORAL | Status: DC | PRN

## 2015-04-09 MED ORDER — LEVOFLOXACIN 500 MG PO TABS
500.0000 mg | ORAL_TABLET | Freq: Once | ORAL | Status: DC
  Filled 2015-04-09: qty 1

## 2015-04-09 MED ORDER — SODIUM CHLORIDE 0.9 % IV SOLN
INTRAVENOUS | Status: DC
  Administered 2015-04-09: 50 mL/h via INTRAVENOUS
  Administered 2015-04-09: 1000 mL via INTRAVENOUS

## 2015-04-09 MED ORDER — SENNOSIDES-DOCUSATE SODIUM 8.6-50 MG PO TABS: 1.0000 | ORAL_TABLET | Freq: Every evening | ORAL | Status: DC | PRN

## 2015-04-09 MED ORDER — LEVOFLOXACIN 500 MG PO TABS
500.0000 mg | ORAL_TABLET | Freq: Every day | ORAL | Status: DC
  Administered 2015-04-09 – 2015-04-11 (×3): 500 mg via ORAL
  Filled 2015-04-09 (×2): qty 1

## 2015-04-09 MED ORDER — LOSARTAN POTASSIUM 25 MG PO TABS
12.5000 mg | ORAL_TABLET | Freq: Every day | ORAL | Status: DC
  Administered 2015-04-10 – 2015-04-11 (×2): 12.5 mg via ORAL
  Filled 2015-04-09 (×2): qty 0.5

## 2015-04-09 MED ORDER — VITAMIN D 1000 UNITS PO TABS
1000.0000 [IU] | ORAL_TABLET | Freq: Every day | ORAL | Status: DC
  Administered 2015-04-10 – 2015-04-11 (×2): 1000 [IU] via ORAL
  Filled 2015-04-09 (×2): qty 1

## 2015-04-09 MED ORDER — FENTANYL CITRATE (PF) 100 MCG/2ML IJ SOLN
25.0000 ug | Freq: Once | INTRAMUSCULAR | Status: AC
  Administered 2015-04-09: 25 ug via INTRAVENOUS
  Filled 2015-04-09: qty 2

## 2015-04-09 MED ORDER — ACETAMINOPHEN 325 MG PO TABS: 650.0000 mg | ORAL_TABLET | Freq: Four times a day (QID) | ORAL | Status: DC | PRN

## 2015-04-09 MED ORDER — ONDANSETRON HCL 4 MG/2ML IJ SOLN
4.0000 mg | Freq: Once | INTRAMUSCULAR | Status: AC
  Administered 2015-04-09: 4 mg via INTRAVENOUS
  Filled 2015-04-09: qty 2

## 2015-04-09 MED ORDER — SERTRALINE HCL 100 MG PO TABS
100.0000 mg | ORAL_TABLET | Freq: Every day | ORAL | Status: DC
  Administered 2015-04-10 – 2015-04-11 (×2): 100 mg via ORAL
  Filled 2015-04-09 (×2): qty 1

## 2015-04-09 MED ORDER — MECLIZINE HCL 25 MG PO TABS
25.0000 mg | ORAL_TABLET | Freq: Once | ORAL | Status: AC
  Administered 2015-04-09: 25 mg via ORAL
  Filled 2015-04-09: qty 1

## 2015-04-09 MED ORDER — ACETAMINOPHEN 325 MG PO TABS: 650.0000 mg | ORAL_TABLET | Freq: Once | ORAL | Status: DC

## 2015-04-09 MED ORDER — ENOXAPARIN SODIUM 40 MG/0.4ML ~~LOC~~ SOLN
40.0000 mg | SUBCUTANEOUS | Status: DC
  Administered 2015-04-10: 40 mg via SUBCUTANEOUS
  Filled 2015-04-09: qty 0.4

## 2015-04-09 MED ORDER — KETOROLAC TROMETHAMINE 15 MG/ML IJ SOLN: 15.0000 mg | Freq: Once | INTRAMUSCULAR | Status: DC

## 2015-04-09 MED ORDER — SODIUM CHLORIDE 0.9 % IV SOLN: INTRAVENOUS | Status: AC

## 2015-04-09 MED ORDER — ACETAMINOPHEN 650 MG RE SUPP: 650.0000 mg | Freq: Four times a day (QID) | RECTAL | Status: DC | PRN

## 2015-04-09 MED ORDER — HYDROCODONE-ACETAMINOPHEN 5-325 MG PO TABS
1.0000 | ORAL_TABLET | ORAL | Status: DC | PRN
  Administered 2015-04-09 – 2015-04-11 (×5): 1 via ORAL
  Filled 2015-04-09 (×5): qty 1

## 2015-04-09 MED ORDER — ROSUVASTATIN CALCIUM 5 MG PO TABS
5.0000 mg | ORAL_TABLET | Freq: Every day | ORAL | Status: DC
  Administered 2015-04-10 – 2015-04-11 (×2): 5 mg via ORAL
  Filled 2015-04-09 (×2): qty 1

## 2015-04-09 MED ORDER — SERTRALINE HCL 100 MG PO TABS: 100.0000 mg | ORAL_TABLET | Freq: Every day | ORAL | Status: DC

## 2015-04-09 NOTE — ED Notes (Signed)
MD at bedside. 

## 2015-04-09 NOTE — ED Provider Notes (Signed)
CSN: [id number]     Arrival date & time 04/09/15  1159 History   First MD Initiated Contact with Patient 04/09/15 1201     Chief Complaint  Patient presents with  . Fall  . Altered Mental Status     (Consider location/radiation/quality/duration/timing/severity/associated sxs/prior Treatment) Patient is a 75 y.o. female presenting with fall. The history is provided by the patient and the EMS personnel.  Fall This is a new (Patient found down on sidewalk outside doctors office after presumed fall. Patient amnestic to the event) problem. The current episode started today. The problem has been unchanged. Associated symptoms include arthralgias and headaches. Pertinent negatives include no abdominal pain, chest pain, chills, coughing, diaphoresis, fatigue, fever, myalgias, nausea, neck pain, rash, sore throat, vomiting or weakness. Associated symptoms comments: Headache. Right wrist, hand, and knee pain.. She has tried nothing for the symptoms.    Past Medical History  Diagnosis Date  . Depression   . Hypertension   . Osteopenia   . Urinary incontinence   . Vertigo   . Acute ear infection   . Syncope     03/2015  . Fx metacarpal     03/2015   Past Surgical History  Procedure Laterality Date  . Rotator cuff repair Right    Family History  Problem Relation Age of Onset  . Heart disease Mother   . Heart failure Mother   . Heart disease Father   . Heart failure Father   . Heart disease Brother   . Heart failure Brother   . Heart failure Brother    Social History  Substance Use Topics  . Smoking status: Former Smoker -- 0.25 packs/day    Types: Cigarettes    Quit date: 01/13/1973  . Smokeless tobacco: Never Used  . Alcohol Use: 2.4 oz/week    2 Glasses of wine, 2 Standard drinks or equivalent per week     Comment: 2 glasses of wine per week   OB History    Gravida Para Term Preterm AB TAB SAB Ectopic Multiple Living   Review of Systems  Constitutional:  Negative for fever, chills, diaphoresis, activity change, appetite change and fatigue.  HENT: Negative for rhinorrhea, sore throat, trouble swallowing and voice change.   Eyes: Negative for photophobia, pain and visual disturbance.  Respiratory: Negative for cough, shortness of breath, wheezing and stridor.   Cardiovascular: Negative for chest pain, palpitations and leg swelling.  Gastrointestinal: Negative for nausea, vomiting, abdominal pain, constipation and anal bleeding.  Endocrine: Negative.   Genitourinary: Negative for dysuria, vaginal bleeding, vaginal discharge and vaginal pain.  Musculoskeletal: Positive for arthralgias. Negative for myalgias, back pain and neck pain.  Skin: Positive for wound (abrasions). Negative for rash.  Allergic/Immunologic: Negative.   Neurological: Positive for syncope and headaches. Negative for dizziness, tremors and weakness.  Psychiatric/Behavioral: Negative for suicidal ideas, sleep disturbance and self-injury.  All other systems reviewed and are negative.     Allergies  Penicillins; Tetracyclines & related; and Codeine  Home Medications   Prior to Admission medications   Medication Sig Start Date End Date Taking? Authorizing Provider  cetirizine (ZYRTEC) 10 MG tablet Take 10 mg by mouth daily as needed for allergies.    Yes Historical Provider, MD  cholecalciferol (VITAMIN D) 1000 UNITS tablet Take 1,000 Units by mouth daily.   Yes Historical Provider, MD  levofloxacin (LEVAQUIN) 500 MG tablet take 1 tablet by mouth once  daily with food 08/21/14  Yes Historical Provider, MD  losartan (COZAAR) 25 MG tablet Take 12.5 mg by mouth daily.    Yes Historical Provider, MD  Multiple Vitamin (MULTIVITAMIN) tablet Take 1 tablet by mouth daily.   Yes Historical Provider, MD  Propylene Glycol (SYSTANE BALANCE) 0.6 % SOLN Place 1 drop into both eyes daily.    Yes Historical Provider, MD  rosuvastatin (CRESTOR) 10 MG tablet Take 5 mg by mouth daily.    Yes  Historical Provider, MD  sertraline (ZOLOFT) 100 MG tablet Take 100 mg by mouth daily.   Yes Historical Provider, MD   BP 134/71 mmHg  Pulse 76  Temp(Src) 97.9 F (36.6 C) (Oral)  Resp 13  Ht 5' (1.524 m)  Wt 58.968 kg  BMI 25.39 kg/m2  SpO2 96%  LMP 01/13/2002 Physical Exam  Constitutional: She is oriented to person, place, and time. She appears well-developed and well-nourished. No distress.  HENT:  Right Ear: External ear normal.  Left Ear: External ear normal.  Nose: Nose normal.  Mouth/Throat: No oropharyngeal exudate.  Large right scalp hematoma. No lacerations. No other injuries noted on physical exam of Head  Eyes: Conjunctivae and EOM are normal. Pupils are equal, round, and reactive to light. No scleral icterus.  Neck: Normal range of motion. Neck supple. No JVD present. No tracheal deviation present. No thyromegaly present.  Cardiovascular: Normal rate, regular rhythm and intact distal pulses.   Pulmonary/Chest: Effort normal and breath sounds normal. No respiratory distress. She has no wheezes. She has no rales.  Abdominal: Soft. Bowel sounds are normal. She exhibits no distension. There is no tenderness.  Musculoskeletal: Normal range of motion. She exhibits tenderness (tenderness to right 5th phalnges and ular side of wrist. No tenderness to anatomical snuff box. Tenderness to right knee).  Neurological: She is alert and oriented to person, place, and time. No cranial nerve deficit. She exhibits normal muscle tone. Coordination normal.  GCS 15. 5/5 strength and sensation intact in all 4 extremities.   Skin: Skin is warm and dry. She is not diaphoretic. No pallor.  Psychiatric: She has a normal mood and affect. She expresses no homicidal and no suicidal ideation. She expresses no suicidal plans and no homicidal plans.  Nursing note and vitals reviewed.   ED Course  Procedures (including critical care time) Labs Review Labs Reviewed  BASIC METABOLIC PANEL - Abnormal;  Notable for the following:    Glucose, Bld 130 (*)    All other components within normal limits  CBC WITH DIFFERENTIAL/PLATELET  TSH  TROPONIN I  URINALYSIS, ROUTINE W REFLEX MICROSCOPIC (NOT AT [facility])  Rosezena Sensor, ED    Imaging Review Dg Wrist Complete Right  04/09/2015  CLINICAL DATA:  Fall after syncope EXAM: RIGHT WRIST - COMPLETE 3+ VIEW COMPARISON:  None. FINDINGS: There is a slightly displaced fracture at the base of the right fifth metacarpal bone, without obvious extension to the articular surface. Carpal bones appear intact and normally aligned. Small dense chronic-appearing calcification overlying the triquetrum, presumably related to previous injury. IMPRESSION: Acute slightly displaced, obliquely oriented, fracture at the base of the right fifth metacarpal bone. No obvious fracture extension to the articular surface. Electronically Signed   By: Bary Richard M.D.   On: 04/09/2015 13:43   Dg Knee 2 Views Right  04/09/2015  CLINICAL DATA:  Right knee pain after fall.  Initial encounter. EXAM: RIGHT KNEE - 1-2 VIEW COMPARISON:  None. FINDINGS: There is no evidence of fracture, dislocation,  or joint effusion. There is no evidence of arthropathy or other focal bone abnormality. Soft tissues are unremarkable. IMPRESSION: Normal right knee. Electronically Signed   By: Lupita Raider, M.D.   On: 04/09/2015 13:41   Ct Head Wo Contrast  04/09/2015  CLINICAL DATA:  Status post fall.  Positive loss of consciousness. EXAM: CT HEAD WITHOUT CONTRAST TECHNIQUE: Contiguous axial images were obtained from the base of the skull through the vertex without intravenous contrast. COMPARISON:  10/03/2012 FINDINGS: There is no evidence of mass effect, midline shift, or extra-axial fluid collections. There is no evidence of a space-occupying lesion or intracranial hemorrhage. There is no evidence of a cortical-based area of acute infarction. There is generalized cerebral atrophy. There is periventricular  white matter low attenuation likely secondary to microangiopathy. The ventricles and sulci are appropriate for the patient's age. The basal cisterns are patent. Visualized portions of the orbits are unremarkable. The visualized portions of the paranasal sinuses and mastoid air cells are unremarkable. Cerebrovascular atherosclerotic calcifications are noted. The osseous structures are unremarkable. There is a right frontal scalp hematoma. IMPRESSION: 1. No acute intracranial pathology. 2. Right frontal scalp hematoma. Electronically Signed   By: Elige Ko   On: 04/09/2015 13:16   Portable Chest 1 View  04/09/2015  CLINICAL DATA:  Syncope, no other chest complaints EXAM: PORTABLE CHEST 1 VIEW COMPARISON:  08/16/2003 FINDINGS: The heart size and mediastinal contours are within normal limits. Both lungs are clear. The visualized skeletal structures are unremarkable. IMPRESSION: No active disease. Electronically Signed   By: Elige Ko   On: 04/09/2015 15:24   Dg Hand Complete Right  04/09/2015  CLINICAL DATA:  Recent fall with right hand pain, initial encounter EXAM: RIGHT HAND - COMPLETE 3+ VIEW COMPARISON:  None. FINDINGS: There is an oblique fracture through the base of the fifth metacarpal. Mild impaction and angulation at the fracture site is noted. No gross soft tissue abnormality is seen. IMPRESSION: Fifth metacarpal fracture. Electronically Signed   By: Alcide Clever M.D.   On: 04/09/2015 13:41   I have personally reviewed and evaluated these images and lab results as part of my medical decision-making.   EKG Interpretation   Date/Time:  Monday April 09 2015 14:31:40 EDT Ventricular Rate:  79 PR Interval:  132 QRS Duration: 91 QT Interval:  404 QTC Calculation: 463 R Axis:   11 Text Interpretation:  poor data quality discard Confirmed by KNAPP  MD-J,  JON ([id number]) on 04/09/2015 2:37:29 PM      MDM   Final diagnoses:  Fall  Syncope, unspecified syncope type  Closed fracture of 5th  metacarpal, initial encounter    The patient is a 75 year old female with a history of vertigo who presents after a unwitnessed syncopal episode outside her doctor's office. The patient was found face down on the sidewalk with a large hematoma on her right frontal forehead. She does not remember the fall and states she does not report anything until she was halfway through her ambulance ride to the emergency department. Repetitive questioning per EMS however this has resolved upon arrival to the emergency department. Physical exam as above. Head CT negative for acute intracranial pathology. Plain films show a right fifth metacarpal fracture. Fracture is closed the patient is completely neurovascularly intact. Ulnar gutter splint placed. Patient is admitted to the hospitalist service under observation criteria for syncope. Patient family expressed understanding and agreement with this plan.  Patient seen with attending, Dr. Lynelle Doctor, who oversaw clinical decision  making.     Lula OlszewskiMike Damarius Karnes, MD 04/09/15 1606  Linwood DibblesJon Knapp, MD 04/09/15 [id number]

## 2015-04-09 NOTE — ED Notes (Signed)
Pt returns from ct with dry heaves.

## 2015-04-09 NOTE — Progress Notes (Addendum)
Patient trasfered from ED to 5W09 via stretcher; alert and oriented x 4; no complaints of pain; IV in Left Hand running NS@50cc /hr; Paitent has orthopedic device on right hand due to fracture on 5th metacarpal; hematoma on right forehead. Orient patient to room and unit; gave patient care guide; instructed how to use the call bell and  fall risk precautions. Will continue to monitor the patient.

## 2015-04-09 NOTE — ED Notes (Signed)
Ice pack applied to forehead. Hematoma approx size of golf ball-- purple in color. Also c/o right hand pain, ring on right ring finger removed.

## 2015-04-09 NOTE — H&P (Signed)
[facility] History and Physical  Carolyn Hart ZOX:[id number] DOB: [birth year] DOA: 04/09/2015  Referring physician: Festus Aloe PCP: Martha Clan, MD   Chief Complaint: syncope/right hand pain  HPI: Carolyn Hart is a delightful 75 y.o. female with a past medical history includes hypertension, vertigo Zentz to the emergency department from her PCPs office with the chief complaint of syncope. Initial evaluation reveals fifth metacarpal fracture scalp hematoma.  Information is obtained from the chart and the patient and the daughter who is at the bedside. Patient reports she was taking her husband to his PCP. She was returning to the car to retrieve forgotten item and her last memory is opening the door for an elderly gentleman and then her next memory is waking up in an ambulance. Reportedly she was found next to her car on the ground in the parking lot. She reports being diagnosed recently with a sinus infection has been taking Levaquin for 5 days and generally "not feeling very well". She states she has taken Levaquin in the past without problems. Associated symptoms include persistent nausea no vomiting but decreased oral intake. She denies any chest pain palpitations cough fever chills abdominal pain diarrhea dysuria hematuria frequency or urgency. She denies any lower extremity edema or orthopnea. She denies headache visual disturbances dizziness. Since she presented to the emergency department she has experienced intermittent nausea  In the emergency department she is afebrile hemodynamically stable and not hypoxic.  Review of Systems:  10 point review of systems complete and all systems are negative except as indicated in the history of present illness  Past Medical History  Diagnosis Date  . Depression   . Hypertension   . Osteopenia   . Urinary incontinence   . Vertigo   . Acute ear infection    Past Surgical History  Procedure Laterality Date  . Rotator cuff repair Right      Social History:  reports that she quit smoking about 42 years ago. Her smoking use included Cigarettes. She smoked 0.25 packs per day. She has never used smokeless tobacco. She reports that she drinks about 2.4 oz of alcohol per week. She reports that she does not use illicit drugs. She was at home she works full-time as a Customer service manager she is independent with ADLs Allergies  Allergen Reactions  . Penicillins Hives and Shortness Of Breath  . Tetracyclines & Related Nausea Only    hives  . Codeine Nausea Only    Family History  Problem Relation Age of Onset  . Heart disease Mother   . Heart failure Mother   . Heart disease Father   . Heart failure Father   . Heart disease Brother   . Heart failure Brother   . Heart failure Brother     Prior to Admission medications   Medication Sig Start Date End Date Taking? Authorizing Provider  cetirizine (ZYRTEC) 10 MG tablet Take 10 mg by mouth daily as needed for allergies.    Yes Historical Provider, MD  cholecalciferol (VITAMIN D) 1000 UNITS tablet Take 1,000 Units by mouth daily.   Yes Historical Provider, MD  levofloxacin (LEVAQUIN) 500 MG tablet take 1 tablet by mouth once daily with food 08/21/14  Yes Historical Provider, MD  losartan (COZAAR) 25 MG tablet Take 12.5 mg by mouth daily.    Yes Historical Provider, MD  Multiple Vitamin (MULTIVITAMIN) tablet Take 1 tablet by mouth daily.   Yes Historical Provider, MD  Propylene Glycol (SYSTANE BALANCE) 0.6 % SOLN Place  1 drop into both eyes daily.    Yes Historical Provider, MD  rosuvastatin (CRESTOR) 10 MG tablet Take 5 mg by mouth daily.    Yes Historical Provider, MD  sertraline (ZOLOFT) 100 MG tablet Take 100 mg by mouth daily.   Yes Historical Provider, MD   Physical Exam: Filed Vitals:   04/09/15 1211 04/09/15 1400 04/09/15 1430  BP: 147/67 131/81 134/71  Pulse: 62 80 76  Temp: 97.9 F (36.6 C)    TempSrc: Oral    Resp: 16 13 13   Height: 5' (1.524 m)    Weight: 58.968 kg  (130 lb)    SpO2: 100% 100% 96%    Wt Readings from Last 3 Encounters:  04/09/15 58.968 kg (130 lb)  08/30/14 60.328 kg (133 lb)  08/17/13 59.33 kg (130 lb 12.8 oz)    General:  Appears calm and comfortable. Large golf ball size hematoma right scalp just above the eye extending to hairline Eyes: PERRL, normal lids, irises & conjunctiva ENT: grossly normal hearing, lips & tongue Mucous membranes of her mouth are pink slightly dry Neck: no LAD, masses or thyromegaly Cardiovascular: RRR, no m/r/g. No LE edema. Pedal pulses present and palpable  Respiratory: CTA bilaterally, no w/r/r. Normal respiratory effort. Abdomen: soft, ntnd positive bowel sounds no guarding Skin: no rash or induration seen on limited exam, ration lateral aspect of right knee no swelling no decreased range of motion Musculoskeletal: grossly normal tone BUE/BLE right left metatarsal with some ecchymosis and swelling at the base extending to the ulnar side of wrist. Tenderness to palpation decreased range of motion. Psychiatric: grossly normal mood and affect, speech fluent and appropriate Neurologic: grossly non-focal. Speech clear facial symmetry           Labs on Admission:  Basic Metabolic Panel:  Recent Labs Lab 04/09/15 1400  NA 140  K 4.0  CL 105  CO2 24  GLUCOSE 130*  BUN 12  CREATININE 0.64  CALCIUM 9.4   Liver Function Tests: No results for input(s): AST, ALT, ALKPHOS, BILITOT, PROT, ALBUMIN in the last 168 hours. No results for input(s): LIPASE, AMYLASE in the last 168 hours. No results for input(s): AMMONIA in the last 168 hours. CBC:  Recent Labs Lab 04/09/15 1400  WBC 9.9  NEUTROABS 7.2  HGB 14.3  HCT 42.6  MCV 87.5  PLT 232   Cardiac Enzymes: No results for input(s): CKTOTAL, CKMB, CKMBINDEX, TROPONINI in the last 168 hours.  BNP (last 3 results) No results for input(s): BNP in the last 8760 hours.  ProBNP (last 3 results) No results for input(s): PROBNP in the last 8760  hours.  CBG: No results for input(s): GLUCAP in the last 168 hours.  Radiological Exams on Admission: Dg Wrist Complete Right  04/09/2015  CLINICAL DATA:  Fall after syncope EXAM: RIGHT WRIST - COMPLETE 3+ VIEW COMPARISON:  None. FINDINGS: There is a slightly displaced fracture at the base of the right fifth metacarpal bone, without obvious extension to the articular surface. Carpal bones appear intact and normally aligned. Small dense chronic-appearing calcification overlying the triquetrum, presumably related to previous injury. IMPRESSION: Acute slightly displaced, obliquely oriented, fracture at the base of the right fifth metacarpal bone. No obvious fracture extension to the articular surface. Electronically Signed   By: Bary RichardStan  Maynard M.D.   On: 04/09/2015 13:43   Dg Knee 2 Views Right  04/09/2015  CLINICAL DATA:  Right knee pain after fall.  Initial encounter. EXAM: RIGHT KNEE - 1-2 VIEW  COMPARISON:  None. FINDINGS: There is no evidence of fracture, dislocation, or joint effusion. There is no evidence of arthropathy or other focal bone abnormality. Soft tissues are unremarkable. IMPRESSION: Normal right knee. Electronically Signed   By: Lupita Raider, M.D.   On: 04/09/2015 13:41   Ct Head Wo Contrast  04/09/2015  CLINICAL DATA:  Status post fall.  Positive loss of consciousness. EXAM: CT HEAD WITHOUT CONTRAST TECHNIQUE: Contiguous axial images were obtained from the base of the skull through the vertex without intravenous contrast. COMPARISON:  10/03/2012 FINDINGS: There is no evidence of mass effect, midline shift, or extra-axial fluid collections. There is no evidence of a space-occupying lesion or intracranial hemorrhage. There is no evidence of a cortical-based area of acute infarction. There is generalized cerebral atrophy. There is periventricular white matter low attenuation likely secondary to microangiopathy. The ventricles and sulci are appropriate for the patient's age. The basal  cisterns are patent. Visualized portions of the orbits are unremarkable. The visualized portions of the paranasal sinuses and mastoid air cells are unremarkable. Cerebrovascular atherosclerotic calcifications are noted. The osseous structures are unremarkable. There is a right frontal scalp hematoma. IMPRESSION: 1. No acute intracranial pathology. 2. Right frontal scalp hematoma. Electronically Signed   By: Elige Ko   On: 04/09/2015 13:16   Portable Chest 1 View  04/09/2015  CLINICAL DATA:  Syncope, no other chest complaints EXAM: PORTABLE CHEST 1 VIEW COMPARISON:  08/16/2003 FINDINGS: The heart size and mediastinal contours are within normal limits. Both lungs are clear. The visualized skeletal structures are unremarkable. IMPRESSION: No active disease. Electronically Signed   By: Elige Ko   On: 04/09/2015 15:24   Dg Hand Complete Right  04/09/2015  CLINICAL DATA:  Recent fall with right hand pain, initial encounter EXAM: RIGHT HAND - COMPLETE 3+ VIEW COMPARISON:  None. FINDINGS: There is an oblique fracture through the base of the fifth metacarpal. Mild impaction and angulation at the fracture site is noted. No gross soft tissue abnormality is seen. IMPRESSION: Fifth metacarpal fracture. Electronically Signed   By: Alcide Clever M.D.   On: 04/09/2015 13:41    EKG: Independently reviewed. Sinus rhythm Artifact in lead  Assessment/Plan Principal Problem:   Syncope Active Problems:   Depression   Hypertension   Fx metacarpal   Scalp hematoma  #1. Syncope. Likely multifactorial i.e. recent diagnosis of sinus infection on Levaquin, related to decreased oral intake. CT the head without acute abnormality. Complete metabolic panel unremarkable initial troponin negative EKG without acute changes. On admission she's afebrile hemodynamically stable and nontoxic appearing -Admit to telemetry -Obtain urinalysis and chest x-ray -Obtain echocardiogram -Obtain TSH -Check orthostatic -Gentle IV  fluids -Continue Levaquin  #2. Extra metacarpal. Per x-ray -Splint applied per ED -Analgesia -Eyes -Elevate -Outpatient follow-up with orthopedics  #3. Scalp hematoma. CT of the head without acute changes. No laceration noted - cold Compress -Analgesia -Up with assistance  4. Hypertension. Controlled in the emergency department. Home medications include losartan.  -We'll continue losartan   Code Status: full DVT Prophylaxis: Family Communication: daughter at bedside Disposition Plan: home when ready  Time spent: 60 minutes  [facility] M [facility]

## 2015-04-09 NOTE — Progress Notes (Signed)
Orthopedic Tech Progress Note Patient Details:  Carolyn Hart 06/21/1940 [id number]  Ortho Devices Type of Ortho Device: Ace wrap, Ulna gutter splint Ortho Device/Splint Location: rue Ortho Device/Splint Interventions: Application   [name] 04/09/2015, 4:26 PM

## 2015-04-09 NOTE — ED Notes (Signed)
Pt to ED via [facility] from [facility]- pt was found on sidewalk outside office-- unwitnessed fall. Has large hematoma to right side of forehead. Pt had repetitive questioning, does not remember what happened. States first memory is waking up in ambulance. Pt is alert/oriented to person , place, time- not event.

## 2015-04-09 NOTE — ED Notes (Signed)
Pt to ct 

## 2015-04-10 ENCOUNTER — Observation Stay (HOSPITAL_COMMUNITY): Payer: Medicare Other

## 2015-04-10 ENCOUNTER — Observation Stay (HOSPITAL_BASED_OUTPATIENT_CLINIC_OR_DEPARTMENT_OTHER): Payer: Medicare Other

## 2015-04-10 ENCOUNTER — Encounter (HOSPITAL_COMMUNITY): Payer: Self-pay | Admitting: Radiology

## 2015-04-10 DIAGNOSIS — M797 Fibromyalgia: Secondary | ICD-10-CM | POA: Diagnosis present

## 2015-04-10 DIAGNOSIS — S62306A Unspecified fracture of fifth metacarpal bone, right hand, initial encounter for closed fracture: Secondary | ICD-10-CM | POA: Diagnosis present

## 2015-04-10 DIAGNOSIS — Z87891 Personal history of nicotine dependence: Secondary | ICD-10-CM | POA: Diagnosis not present

## 2015-04-10 DIAGNOSIS — E7800 Pure hypercholesterolemia, unspecified: Secondary | ICD-10-CM | POA: Diagnosis present

## 2015-04-10 DIAGNOSIS — W19XXXA Unspecified fall, initial encounter: Secondary | ICD-10-CM | POA: Diagnosis present

## 2015-04-10 DIAGNOSIS — I10 Essential (primary) hypertension: Secondary | ICD-10-CM | POA: Insufficient documentation

## 2015-04-10 DIAGNOSIS — R55 Syncope and collapse: Secondary | ICD-10-CM | POA: Diagnosis present

## 2015-04-10 DIAGNOSIS — I639 Cerebral infarction, unspecified: Secondary | ICD-10-CM | POA: Diagnosis present

## 2015-04-10 DIAGNOSIS — S62308A Unspecified fracture of other metacarpal bone, initial encounter for closed fracture: Secondary | ICD-10-CM | POA: Diagnosis not present

## 2015-04-10 DIAGNOSIS — E860 Dehydration: Secondary | ICD-10-CM | POA: Diagnosis present

## 2015-04-10 DIAGNOSIS — Z79899 Other long term (current) drug therapy: Secondary | ICD-10-CM | POA: Diagnosis not present

## 2015-04-10 DIAGNOSIS — Y92481 Parking lot as the place of occurrence of the external cause: Secondary | ICD-10-CM | POA: Diagnosis not present

## 2015-04-10 DIAGNOSIS — F329 Major depressive disorder, single episode, unspecified: Secondary | ICD-10-CM | POA: Diagnosis present

## 2015-04-10 DIAGNOSIS — R569 Unspecified convulsions: Secondary | ICD-10-CM | POA: Diagnosis present

## 2015-04-10 DIAGNOSIS — S0003XA Contusion of scalp, initial encounter: Secondary | ICD-10-CM | POA: Diagnosis present

## 2015-04-10 DIAGNOSIS — J329 Chronic sinusitis, unspecified: Secondary | ICD-10-CM | POA: Diagnosis present

## 2015-04-10 LAB — TROPONIN I

## 2015-04-10 LAB — ECHOCARDIOGRAM COMPLETE
Height: 61 in
WEIGHTICAEL: 2190.49 [oz_av]

## 2015-04-10 LAB — GLUCOSE, CAPILLARY: GLUCOSE-CAPILLARY: 91 mg/dL (ref 65–99)

## 2015-04-10 MED ORDER — ASPIRIN 325 MG PO TABS
325.0000 mg | ORAL_TABLET | Freq: Every day | ORAL | Status: DC
  Administered 2015-04-10: 325 mg via ORAL
  Filled 2015-04-10: qty 1

## 2015-04-10 MED ORDER — MECLIZINE HCL 12.5 MG PO TABS
12.5000 mg | ORAL_TABLET | Freq: Three times a day (TID) | ORAL | Status: DC | PRN
  Administered 2015-04-10: 12.5 mg via ORAL
  Filled 2015-04-10 (×2): qty 1

## 2015-04-10 MED ORDER — IOHEXOL 350 MG/ML SOLN
50.0000 mL | Freq: Once | INTRAVENOUS | Status: AC | PRN
  Administered 2015-04-10: 50 mL via INTRAVENOUS

## 2015-04-10 MED ORDER — CLOPIDOGREL BISULFATE 75 MG PO TABS
75.0000 mg | ORAL_TABLET | Freq: Every day | ORAL | Status: DC
  Administered 2015-04-11: 75 mg via ORAL
  Filled 2015-04-10: qty 1

## 2015-04-10 MED ORDER — STROKE: EARLY STAGES OF RECOVERY BOOK
Freq: Once | Status: DC
  Filled 2015-04-10: qty 1

## 2015-04-10 NOTE — Progress Notes (Signed)
  Echocardiogram 2D Echocardiogram has been performed.  Carolyn Hart 04/10/2015, 11:26 AM

## 2015-04-10 NOTE — Evaluation (Signed)
Physical Therapy Evaluation Patient Details Name: Carolyn Hart MRN: [id number] DOB: 08/13/1940 Today's Date: 04/10/2015   History of Present Illness  s/p syncope 3/27 with LOC, fall. 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 Patient reports feeling a sense of leaning or falling to her right with movement. Tested for BPPV with + results (although not classic--pt did have meclizine prior to assessment). Treated for Lt posterior canal BPPV. Pt with nausea and deferred treatment of possible Rt BPPV. Pt currently with functional limitations due to the deficits listed below (see PT Problem List).  Pt will benefit from skilled PT to increase their independence and safety with mobility to allow discharge to the venue listed below.       Follow Up Recommendations Outpatient PT;Supervision/Assistance - 24 hour (Vestibular Rehab)    Equipment Recommendations   (TBA)    Recommendations for Other Services OT consult     Precautions / Restrictions Precautions Precautions: Fall Required Braces or Orthoses: Other Brace/Splint Other Brace/Splint: Rt forearm splint Restrictions Weight Bearing Restrictions: No Other Position/Activity Restrictions: None ordered; ? restricted RUE   Vestibular Assessment   04/10/15 1701  Vestibular Assessment  General Observation Pt reports feeling she's falling/leaning to the right with certain movements; typically lasts seconds, although rolling down corridor to a test made it feel worse  Symptom Behavior  Type of Dizziness Imbalance  Frequency of Dizziness with movement  Duration of Dizziness seconds, mostly  Aggravating Factors Activity in general;Comment (rolling in bed down corridor)  Relieving Factors Head stationary;Rest  Occulomotor Exam  Occulomotor Alignment Normal  Spontaneous Absent  Gaze-induced Right beating  nystagmus with R gaze (only 2 beats, WNL)  Head shaking Horizontal (NT)  Head Shaking Vertical (NT)  Smooth Pursuits Intact  Saccades (NT)  Positional Testing  Dix-Hallpike Dix-Hallpike Right;Dix-Hallpike Left  Horizontal Canal Testing Horizontal Canal Right;Horizontal Canal Left  Dix-Hallpike Right  Dix-Hallpike Right Duration 20  Dix-Hallpike Right Symptoms Upbeat Nystagmus (?rotary)  Dix-Hallpike Left  Dix-Hallpike Left Duration 10  Dix-Hallpike Left Symptoms Upbeat Nystagmus (no rotation noted)  Horizontal Canal Right  Horizontal Canal Right Duration 0  Horizontal Canal Right Symptoms Normal  Horizontal Canal Left  Horizontal Canal Left Duration 0  Horizontal Canal Left Symptoms Normal  Orthostatics  Orthostatics Comment Normal, see vitals   Patient with more severe symptoms with Lt Hallpike Dix than Rt [employer] WorksHallpike Dix, therefore intiated treatment for Lt BPPV.   Mobility  Bed Mobility Overal bed mobility: Needs Assistance Bed Mobility: Rolling;Sidelying to Sit Rolling: Supervision Sidelying to sit: Min assist       General bed mobility comments: during Epley maneuver  Transfers Overall transfer level: Needs assistance Equipment used: None Transfers: Sit to/from [employer] CorporationStand;Stand Pivot Transfers Sit to Stand: Min assist Stand pivot transfers: Min assist       General transfer comment: +vertigo/sense of imbalance with sit to stand  Ambulation/Gait             General Gait Details: not tested due to incr nausea with cannalith repositioning.  Stairs            Wheelchair Mobility    Modified Rankin (Stroke Patients Only) Modified Rankin (Stroke Patients Only) Pre-Morbid Rankin Score: No symptoms Modified Rankin: Moderately severe disability     Balance Overall balance assessment: Needs assistance Sitting-balance support: Single extremity supported;Feet unsupported Sitting balance-Leahy Scale: Poor Sitting balance - Comments:  incr sway with  reports of vertigo after Epley    Standing balance support: Single extremity supported Standing balance-Leahy Scale: Poor Standing balance comment: unsteady                             Pertinent Vitals/Pain Pain Assessment: Faces Faces Pain Scale: Hurts even more Pain Location: Rt forehead Pain Descriptors / Indicators: Aching Pain Intervention(s): Limited activity within patient's tolerance;Monitored during session;Repositioned    Home Living Family/patient expects to be discharged to:: Private residence Living Arrangements: Spouse/significant other Available Help at Discharge: Family;Available 24 hours/day             Additional Comments: Husband is bipolar with alot of anxiety; wife doesn't have to physcially assist him, but she does provide calming/redirection    Prior Function Level of Independence: Independent         Comments: works in Medical illustrator        Extremity/Trunk Assessment   Upper Extremity Assessment: RUE deficits/detail RUE Deficits / Details: in gutter splint; self-selects NWB'g          Lower Extremity Assessment: Overall WFL for tasks assessed      Cervical / Trunk Assessment: Normal  Communication   Communication: No difficulties  Cognition Arousal/Alertness: Awake/alert Behavior During Therapy: WFL for tasks assessed/performed Overall Cognitive Status: Within Functional Limits for tasks assessed                      General Comments General comments (skin integrity, edema, etc.): Daughter present throughout    Exercises        Assessment/Plan    PT Assessment Patient needs continued PT services  PT Diagnosis Difficulty walking   PT Problem List Decreased activity tolerance;Decreased balance;Decreased mobility;Decreased knowledge of use of DME;Decreased knowledge of precautions;Pain  PT Treatment Interventions DME instruction;Gait training;Stair training;Functional mobility  training;Therapeutic activities;Balance training;Neuromuscular re-education;Patient/family education (cannalith repositioning)   PT Goals (Current goals can be found in the Care Plan section) Acute Rehab PT Goals Patient Stated Goal: stop the dizziness PT Goal Formulation: With patient Time For Goal Achievement: 04/13/15 Potential to Achieve Goals: Good    Frequency Min 4X/week   Barriers to discharge        Co-evaluation               End of Session   Activity Tolerance: Treatment limited secondary to medical complications (Comment) (nausea) Patient left: in chair;with call bell/phone within reach;with family/visitor present Nurse Communication: Mobility status (avoid lying back/supine x 1 hr)    Functional Assessment Tool Used: clinical judgement Functional Limitation: Mobility: Walking and moving around Mobility: Walking and Moving Around Current Status (Z6109): At least 40 percent but less than 60 percent impaired, limited or restricted Mobility: Walking and Moving Around Goal Status [phone]): At least 1 percent but less than 20 percent impaired, limited or restricted    Time: 0981-1914 PT Time Calculation (min) (ACUTE ONLY): 39 min   Charges:   PT Evaluation $PT Eval Moderate Complexity: 1 Procedure PT Treatments $Canalith Rep Proc: 8-22 mins   PT G Codes:   PT G-Codes **NOT FOR INPATIENT CLASS** Functional Assessment Tool Used: clinical judgement Functional Limitation: Mobility: Walking and moving around Mobility: Walking and Moving Around Current Status (N8295): At least 40 percent but less than 60 percent impaired, limited or restricted Mobility: Walking and Moving Around Goal Status [phone]): At least 1 percent but less  than 20 percent impaired, limited or restricted    Tsion Inghram 04/10/2015, 5:28 PM  Pager [phone]

## 2015-04-10 NOTE — Progress Notes (Signed)
EEG Completed; Results Pending  

## 2015-04-10 NOTE — Procedures (Signed)
History: Carolyn Hart is an 75 y.o. female patient with syncope. Routine inpatient EEG was performed for further evaluation.   Patient Active Problem List   Diagnosis Date Noted  . Closed fracture of 5th metacarpal   . Essential hypertension   . Fall   . Syncope 04/09/2015  . Fx metacarpal 04/09/2015  . Scalp hematoma 04/09/2015  . Depression   . Hypertension   . Vertigo      Current facility-administered medications:  .  acetaminophen (TYLENOL) tablet 650 mg, 650 mg, Oral, Q6H PRN **OR** acetaminophen (TYLENOL) suppository 650 mg, 650 mg, Rectal, Q6H PRN, Gwenyth BenderKaren M Black, NP .  acetaminophen (TYLENOL) tablet 650 mg, 650 mg, Oral, Once, Lula OlszewskiMike Goebel, MD, 650 mg at 04/09/15 1517 .  cholecalciferol (VITAMIN D) tablet 1,000 Units, 1,000 Units, Oral, Daily, Gwenyth BenderKaren M Black, NP, 1,000 Units at 04/10/15 0908 .  enoxaparin (LOVENOX) injection 40 mg, 40 mg, Subcutaneous, Q24H, Lesle ChrisKaren M Black, NP, 40 mg at 04/10/15 1446 .  HYDROcodone-acetaminophen (NORCO/VICODIN) 5-325 MG per tablet 1 tablet, 1 tablet, Oral, Q4H PRN, Gwenyth BenderKaren M Black, NP, 1 tablet at 04/10/15 1417 .  levofloxacin (LEVAQUIN) tablet 500 mg, 500 mg, Oral, Daily, Lesle ChrisKaren M Black, NP, 500 mg at 04/10/15 0908 .  losartan (COZAAR) tablet 12.5 mg, 12.5 mg, Oral, Daily, Lesle ChrisKaren M Black, NP, 12.5 mg at 04/10/15 0908 .  meclizine (ANTIVERT) tablet 12.5 mg, 12.5 mg, Oral, TID PRN, Richarda OverlieNayana Abrol, MD, 12.5 mg at 04/10/15 1553 .  rosuvastatin (CRESTOR) tablet 5 mg, 5 mg, Oral, Daily, Lesle ChrisKaren M Black, NP, 5 mg at 04/10/15 0908 .  senna-docusate (Senokot-S) tablet 1 tablet, 1 tablet, Oral, QHS PRN, Lesle ChrisKaren M Black, NP .  sertraline (ZOLOFT) tablet 100 mg, 100 mg, Oral, Daily, Lesle ChrisKaren M Black, NP, 100 mg at 04/10/15 0908 .  traZODone (DESYREL) tablet 25 mg, 25 mg, Oral, QHS PRN, Gwenyth BenderKaren M Black, NP   Introduction:  This is a 19 channel routine scalp EEG performed at the bedside with bipolar and monopolar montages arranged in accordance to the international 10/20  system of electrode placement. One channel was dedicated to EKG recording.   Findings:  The background rhythm was normal 9-10 Hz alpha . No definite evidence of abnormal epileptiform discharges or electrographic seizures were noted during this recording.   Impression:  Unremarkable awake and drowsy routine inpatient EEG. Clinical correlation is recommended .

## 2015-04-10 NOTE — Progress Notes (Addendum)
[facility] PROGRESS NOTE  Carolyn Hart WJX:[id number] DOB: [birth year] DOA: 04/09/2015 PCP: Martha Clan, MD  Length of stay:    Assessment/Plan: Principal Problem:   Syncope Active Problems:   Depression   Hypertension   Fx metacarpal   Scalp hematoma   Closed fracture of 5th metacarpal   Essential hypertension   Fall   Brief summary 75 y.o. female with a Past Medical History of Depression, HTN, Vertigo who presents with syncopal episode. Syncope orders utilized. Suspect from underlying sinusitis w/ possible inner ear irritation causing some amount of dizziness or vertigo and dehydration vs arrythmia.    Assessment and plan #1. Syncope. Likely multifactorial i.e. recent diagnosis of sinus infection on Levaquin, related to decreased oral intake. Orthostatic syncope  VS seizure.CT the head without acute abnormality. UA, chest x-ray negative.  Complete metabolic panel unremarkable initial troponin negative EKG without acute changes. On admission she's afebrile hemodynamically stable and nontoxic appearing Telemetry uneventful, cardiac enzymes negative 3  -Obtain echocardiogram TSH 1.53  -Check orthostatic pending  -Gentle IV fluids -Continue Levaquin MRI of the brain positive for  CVA-neurology consult requested  EEG adult    #2. Extra metacarpal. Per x-ray -Splint applied per ED -Analgesia -Eyes -Elevate -Outpatient follow-up with orthopedics   #3. Scalp hematoma. CT of the head without acute changes. No laceration noted - cold Compress -Analgesia -Up with assistance  4. Hypertension. Controlled in the emergency department. Home medications include losartan.  -We'll continue losartan    DVT prophylaxsis Lovenox  Code Status:      Code Status Orders        Start     Ordered   04/09/15 1522  Full code   Continuous     04/09/15 1524    Family Communication: Discussed in detail with the patient, all imaging results, lab results explained  to the patient   Disposition Plan:  Anticipate discharge tomorrow     Consultants:  None  Procedures:  None  Antibiotics: Anti-infectives    Start     Dose/Rate Route Frequency Ordered Stop   04/09/15 1545  levofloxacin (LEVAQUIN) tablet 500 mg     500 mg Oral Daily 04/09/15 1531     04/09/15 1500  levofloxacin (LEVAQUIN) tablet 500 mg  Status:  Discontinued     500 mg Oral  Once 04/09/15 1449 04/09/15 1531         HPI/Subjective: Patient continues to be dizzy with trying to sit up in bed, Minimal movement  Objective: Filed Vitals:   04/09/15 1747 04/09/15 1808 04/09/15 2230 04/10/15 0504  BP: 112/94 124/72 116/66 124/61  Pulse: 91 82 83 74  Temp:  97.9 F (36.6 C) 97.3 F (36.3 C) 98 F (36.7 C)  TempSrc:   Oral Oral  Resp: Height:   (1.549 m)    Weight:  63.458 kg (139 lb 14.4 oz)  62.1 kg (136 lb 14.5 oz)  SpO2: 97% 99% 96% 97%    Intake/Output Summary (Last 24 hours) at 04/10/15 0912 Last data filed at 04/10/15 2130  Gross per 24 hour  Intake 455.83 ml  Output    400 ml  Net  55.83 ml    Exam:  General: No acute respiratory distress Lungs: Clear to auscultation bilaterally without wheezes or crackles Cardiovascular: Regular rate and rhythm without murmur gallop or rub normal S1 and S2 Abdomen: Nontender, nondistended, soft, bowel sounds positive, no rebound, no ascites, no appreciable  mass Extremities: No significant cyanosis, clubbing, or edema bilateral lower extremities     Data Review   Micro Results No results found for this or any previous visit (from the past 240 hour(s)).  Radiology Reports Dg Wrist Complete Right  04/09/2015  CLINICAL DATA:  Fall after syncope EXAM: RIGHT WRIST - COMPLETE 3+ VIEW COMPARISON:  None. FINDINGS: There is a slightly displaced fracture at the base of the right fifth metacarpal bone, without obvious extension to the articular surface. Carpal bones appear intact and normally aligned.  Small dense chronic-appearing calcification overlying the triquetrum, presumably related to previous injury. IMPRESSION: Acute slightly displaced, obliquely oriented, fracture at the base of the right fifth metacarpal bone. No obvious fracture extension to the articular surface. Electronically Signed   By: Bary RichardStan  Maynard M.D.   On: 04/09/2015 13:43   Dg Knee 2 Views Right  04/09/2015  CLINICAL DATA:  Right knee pain after fall.  Initial encounter. EXAM: RIGHT KNEE - 1-2 VIEW COMPARISON:  None. FINDINGS: There is no evidence of fracture, dislocation, or joint effusion. There is no evidence of arthropathy or other focal bone abnormality. Soft tissues are unremarkable. IMPRESSION: Normal right knee. Electronically Signed   By: Lupita RaiderJames  Green Jr, M.D.   On: 04/09/2015 13:41   Ct Head Wo Contrast  04/09/2015  CLINICAL DATA:  Status post fall.  Positive loss of consciousness. EXAM: CT HEAD WITHOUT CONTRAST TECHNIQUE: Contiguous axial images were obtained from the base of the skull through the vertex without intravenous contrast. COMPARISON:  10/03/2012 FINDINGS: There is no evidence of mass effect, midline shift, or extra-axial fluid collections. There is no evidence of a space-occupying lesion or intracranial hemorrhage. There is no evidence of a cortical-based area of acute infarction. There is generalized cerebral atrophy. There is periventricular white matter low attenuation likely secondary to microangiopathy. The ventricles and sulci are appropriate for the patient's age. The basal cisterns are patent. Visualized portions of the orbits are unremarkable. The visualized portions of the paranasal sinuses and mastoid air cells are unremarkable. Cerebrovascular atherosclerotic calcifications are noted. The osseous structures are unremarkable. There is a right frontal scalp hematoma. IMPRESSION: 1. No acute intracranial pathology. 2. Right frontal scalp hematoma. Electronically Signed   By: Elige KoHetal  Patel   On:  04/09/2015 13:16   Portable Chest 1 View  04/09/2015  CLINICAL DATA:  Syncope, no other chest complaints EXAM: PORTABLE CHEST 1 VIEW COMPARISON:  08/16/2003 FINDINGS: The heart size and mediastinal contours are within normal limits. Both lungs are clear. The visualized skeletal structures are unremarkable. IMPRESSION: No active disease. Electronically Signed   By: Elige KoHetal  Patel   On: 04/09/2015 15:24   Dg Hand Complete Right  04/09/2015  CLINICAL DATA:  Recent fall with right hand pain, initial encounter EXAM: RIGHT HAND - COMPLETE 3+ VIEW COMPARISON:  None. FINDINGS: There is an oblique fracture through the base of the fifth metacarpal. Mild impaction and angulation at the fracture site is noted. No gross soft tissue abnormality is seen. IMPRESSION: Fifth metacarpal fracture. Electronically Signed   By: Alcide CleverMark  Lukens M.D.   On: 04/09/2015 13:41     CBC  Recent Labs Lab 04/09/15 1400  WBC 9.9  HGB 14.3  HCT 42.6  PLT 232  MCV 87.5  MCH 29.4  MCHC 33.6  RDW 13.2  LYMPHSABS 2.1  MONOABS 0.5  EOSABS 0.1  BASOSABS 0.0    Chemistries   Recent Labs Lab 04/09/15 1400  NA 140  K 4.0  CL 105  CO2 24  GLUCOSE 130*  BUN 12  CREATININE 0.64  CALCIUM 9.4   ------------------------------------------------------------------------------------------------------------------ estimated creatinine clearance is 51.3 mL/min (by C-G formula based on Cr of 0.64). ------------------------------------------------------------------------------------------------------------------ No results for input(s): HGBA1C in the last 72 hours. ------------------------------------------------------------------------------------------------------------------ No results for input(s): CHOL, HDL, LDLCALC, TRIG, CHOLHDL, LDLDIRECT in the last 72 hours. ------------------------------------------------------------------------------------------------------------------  Recent Labs  04/09/15 1823  TSH 1.530    ------------------------------------------------------------------------------------------------------------------ No results for input(s): VITAMINB12, FOLATE, FERRITIN, TIBC, IRON, RETICCTPCT in the last 72 hours.  Coagulation profile No results for input(s): INR, PROTIME in the last 168 hours.  No results for input(s): DDIMER in the last 72 hours.  Cardiac Enzymes  Recent Labs Lab 04/09/15 1823 04/10/15 0518  TROPONINI <0.03 <0.03   ------------------------------------------------------------------------------------------------------------------ Invalid input(s): POCBNP   CBG:  Recent Labs Lab 04/10/15 0508  GLUCAP 91       Studies: Dg Wrist Complete Right  04/09/2015  CLINICAL DATA:  Fall after syncope EXAM: RIGHT WRIST - COMPLETE 3+ VIEW COMPARISON:  None. FINDINGS: There is a slightly displaced fracture at the base of the right fifth metacarpal bone, without obvious extension to the articular surface. Carpal bones appear intact and normally aligned. Small dense chronic-appearing calcification overlying the triquetrum, presumably related to previous injury. IMPRESSION: Acute slightly displaced, obliquely oriented, fracture at the base of the right fifth metacarpal bone. No obvious fracture extension to the articular surface. Electronically Signed   By: Bary Richard M.D.   On: 04/09/2015 13:43   Dg Knee 2 Views Right  04/09/2015  CLINICAL DATA:  Right knee pain after fall.  Initial encounter. EXAM: RIGHT KNEE - 1-2 VIEW COMPARISON:  None. FINDINGS: There is no evidence of fracture, dislocation, or joint effusion. There is no evidence of arthropathy or other focal bone abnormality. Soft tissues are unremarkable. IMPRESSION: Normal right knee. Electronically Signed   By: Lupita Raider, M.D.   On: 04/09/2015 13:41   Ct Head Wo Contrast  04/09/2015  CLINICAL DATA:  Status post fall.  Positive loss of consciousness. EXAM: CT HEAD WITHOUT CONTRAST TECHNIQUE: Contiguous  axial images were obtained from the base of the skull through the vertex without intravenous contrast. COMPARISON:  10/03/2012 FINDINGS: There is no evidence of mass effect, midline shift, or extra-axial fluid collections. There is no evidence of a space-occupying lesion or intracranial hemorrhage. There is no evidence of a cortical-based area of acute infarction. There is generalized cerebral atrophy. There is periventricular white matter low attenuation likely secondary to microangiopathy. The ventricles and sulci are appropriate for the patient's age. The basal cisterns are patent. Visualized portions of the orbits are unremarkable. The visualized portions of the paranasal sinuses and mastoid air cells are unremarkable. Cerebrovascular atherosclerotic calcifications are noted. The osseous structures are unremarkable. There is a right frontal scalp hematoma. IMPRESSION: 1. No acute intracranial pathology. 2. Right frontal scalp hematoma. Electronically Signed   By: Elige Ko   On: 04/09/2015 13:16   Portable Chest 1 View  04/09/2015  CLINICAL DATA:  Syncope, no other chest complaints EXAM: PORTABLE CHEST 1 VIEW COMPARISON:  08/16/2003 FINDINGS: The heart size and mediastinal contours are within normal limits. Both lungs are clear. The visualized skeletal structures are unremarkable. IMPRESSION: No active disease. Electronically Signed   By: Elige Ko   On: 04/09/2015 15:24   Dg Hand Complete Right  04/09/2015  CLINICAL DATA:  Recent fall with right hand pain, initial encounter EXAM: RIGHT HAND - COMPLETE 3+ VIEW COMPARISON:  None. FINDINGS: There  is an oblique fracture through the base of the fifth metacarpal. Mild impaction and angulation at the fracture site is noted. No gross soft tissue abnormality is seen. IMPRESSION: Fifth metacarpal fracture. Electronically Signed   By: Alcide Clever M.D.   On: 04/09/2015 13:41      No results found for: HGBA1C Lab Results  Component Value Date    CREATININE 0.64 04/09/2015       Scheduled Meds: . acetaminophen  650 mg Oral Once  . cholecalciferol  1,000 Units Oral Daily  . enoxaparin (LOVENOX) injection  40 mg Subcutaneous Q24H  . levofloxacin  500 mg Oral Daily  . losartan  12.5 mg Oral Daily  . rosuvastatin  5 mg Oral Daily  . sertraline  100 mg Oral Daily   Continuous Infusions:   Principal Problem:   Syncope Active Problems:   Depression   Hypertension   Fx metacarpal   Scalp hematoma   Closed fracture of 5th metacarpal   Essential hypertension   Fall    Time spent: 45 minutes   [facility]  [facility] Pager [phone]. If 7PM-7AM, please contact night-coverage at [email or web address], password [facility] 04/10/2015, 9:12 AM

## 2015-04-10 NOTE — Care Management Obs Status (Signed)
MEDICARE OBSERVATION STATUS NOTIFICATION   Patient Details  Name: Carolyn Hart MRN: [id number] Date of Birth: 03/04/1940   Medicare Observation Status Notification Given:  Yes    Lawerance Sabalebbie Amna Welker, RN 04/10/2015, 10:31 AM

## 2015-04-11 LAB — COMPREHENSIVE METABOLIC PANEL
ALT: 11 U/L — ABNORMAL LOW (ref 14–54)
AST: 17 U/L (ref 15–41)
Albumin: 3.4 g/dL — ABNORMAL LOW (ref 3.5–5.0)
Alkaline Phosphatase: 51 U/L (ref 38–126)
Anion gap: 8 (ref 5–15)
BUN: 14 mg/dL (ref 6–20)
CHLORIDE: 105 mmol/L (ref 101–111)
CO2: 27 mmol/L (ref 22–32)
CREATININE: 0.68 mg/dL (ref 0.44–1.00)
Calcium: 8.8 mg/dL — ABNORMAL LOW (ref 8.9–10.3)
Glucose, Bld: 94 mg/dL (ref 65–99)
POTASSIUM: 4 mmol/L (ref 3.5–5.1)
Sodium: 140 mmol/L (ref 135–145)
Total Bilirubin: 0.6 mg/dL (ref 0.3–1.2)
Total Protein: 5.8 g/dL — ABNORMAL LOW (ref 6.5–8.1)

## 2015-04-11 LAB — CBC
HCT: 36.7 % (ref 36.0–46.0)
Hemoglobin: 12.1 g/dL (ref 12.0–15.0)
MCH: 28.9 pg (ref 26.0–34.0)
MCHC: 33 g/dL (ref 30.0–36.0)
MCV: 87.6 fL (ref 78.0–100.0)
PLATELETS: 202 10*3/uL (ref 150–400)
RBC: 4.19 MIL/uL (ref 3.87–5.11)
RDW: 13.1 % (ref 11.5–15.5)
WBC: 6.4 10*3/uL (ref 4.0–10.5)

## 2015-04-11 LAB — FOLATE: Folate: 12.4 ng/mL (ref >5.9)

## 2015-04-11 LAB — LIPID PANEL
CHOL/HDL RATIO: 2.5 ratio
Cholesterol: 149 mg/dL (ref 0–200)
HDL: 59 mg/dL (ref >40)
LDL CALC: 76 mg/dL (ref 0–99)
TRIGLYCERIDES: 69 mg/dL (ref <150)
VLDL: 14 mg/dL (ref 0–40)

## 2015-04-11 LAB — VITAMIN B12: VITAMIN B 12: 275 pg/mL (ref 180–914)

## 2015-04-11 LAB — GLUCOSE, CAPILLARY: Glucose-Capillary: 95 mg/dL (ref 65–99)

## 2015-04-11 MED ORDER — ATORVASTATIN CALCIUM 10 MG PO TABS: 10.0000 mg | ORAL_TABLET | Freq: Every day | ORAL | Status: DC

## 2015-04-11 MED ORDER — HYDROCODONE-ACETAMINOPHEN 5-325 MG PO TABS: 1.0000 | ORAL_TABLET | ORAL | Status: DC | PRN

## 2015-04-11 MED ORDER — CLOPIDOGREL BISULFATE 75 MG PO TABS: 75.0000 mg | ORAL_TABLET | Freq: Every day | ORAL | Status: DC

## 2015-04-11 MED ORDER — MECLIZINE HCL 12.5 MG PO TABS: 12.5000 mg | ORAL_TABLET | Freq: Three times a day (TID) | ORAL | Status: DC | PRN

## 2015-04-11 NOTE — Progress Notes (Signed)
Physical Therapy Treatment Patient Details Name: Carolyn PennaJoyce H Nemitz MRN: [id number] DOB: 03/20/1940 Today's Date: 04/11/2015    History of Present Illness s/p syncope 3/27 with LOC, fall. 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 Pt currently with functional limitations due to residual vertigo as well as decr balance and endurance s/p fall.   Left BPPV fully resolved per testing.  Positive for right BPPV and treated via Epley maneuver.   Pt was able to ambulate with minor LOB in which this PT recommends buying a straight cane for support and 24 hour care until vertigo is fully resolved.  Pt and daughter agree and will f/u at Outpt PT.  In the meantime, they have the [employer]elow[employer]t Daroff exercise to continue to treat BPPV.  Pt will also attend Outpt PT to continue to treat vertigo until symptoms are resolved as pt could have a hypofunction as well but could not assess today due to treatment for right BPPV.  Pt will benefit from skilled PT to increase their independence and safety with mobility to allow discharge to the venue listed below.    Follow Up Recommendations  Outpatient PT;Supervision/Assistance - 24 hour (Vestibular Rehab)     Equipment Recommendations  Cane (pt to pick one up on way home)    Recommendations for Other Services OT consult     Precautions / Restrictions Precautions Precautions: Fall Required Braces or Orthoses: Other Brace/Splint Other Brace/Splint: Rt forearm splint Restrictions Weight Bearing Restrictions: No Other Position/Activity Restrictions: None ordered; ? restricted RUE    04/11/15 1325  Vestibular Assessment  General Observation Pt feels better but symptoms still there when she sits up or stands.   Symptom Behavior  Type of Dizziness Imbalance  Frequency of Dizziness with movement  Duration of Dizziness seconds, mostly  Aggravating  Factors Activity in general;Sit to stand;Supine to sit  Relieving Factors Head stationary;Rest  Occulomotor Exam  Occulomotor Alignment Normal  Spontaneous Absent  Gaze-induced (NT)  Head shaking Horizontal (NT)  Head Shaking Vertical (NT)  Smooth Pursuits Intact  Saccades (NT)  Positional Testing  Dix-Hallpike Dix-Hallpike Right;Dix-Hallpike Left  Dix-Hallpike Right  Dix-Hallpike Right Duration 10  Dix-Hallpike Right Symptoms Upbeat, right rotatory nystagmus  Dix-Hallpike Left  Dix-Hallpike Left Duration 0  Dix-Hallpike Left Symptoms No nystagmus  Orthostatics  Orthostatics Comment Normal, see vitals     Mobility  Bed Mobility Overal bed mobility: Needs Assistance Bed Mobility: Rolling;Sidelying to Sit Rolling: Supervision Sidelying to sit: Supervision       General bed mobility comments: during Epley maneuver  Pt tested positive for right BPPV as shown above and treated with Epley maneuver for right BPPV.  Left BPPV fully resolved after treatment yesterday.    Transfers Overall transfer level: Needs assistance Equipment used: None Transfers: Sit to/from Stand Sit to Stand: Min guard         General transfer comment: +vertigo/sense of imbalance with sit to stand but pt reports much better than yesterday.    Ambulation/Gait Ambulation/Gait assistance: Min guard;Min assist Ambulation Distance (Feet): 300 Feet Assistive device: 1 person hand held assist Gait Pattern/deviations: Step-through pattern;Decreased stride length;Scissoring;Staggering left;Narrow base of support   Gait velocity interpretation: <1.8 ft/sec, indicative of risk for recurrent falls General Gait Details: Pt was able to ambulate with min to min guard assist with incr stability with HHA of 1.  Spoke with pt and  daughter that pt may benefit from use of cane given that pt had several small LOB in which she would self correct however is a fall risk due to this.  Daughter is going to stay with pt at  all times until pt is back to 100%.  REcommended cane for incr stability and educated pt and family how to measure cane as they are going to purchase one on the way home.     Stairs Stairs: Yes Stairs assistance: Min guard Stair Management: One rail Right;Step to pattern;Sideways;Forwards Number of Stairs: 4 General stair comments: Pt was able to ascend and descend without physical assist.  Has stair lift in the home that she states she will use if she needs to for awhile as well.    Wheelchair Mobility    Modified Rankin (Stroke Patients Only) Modified Rankin (Stroke Patients Only) Pre-Morbid Rankin Score: No symptoms Modified Rankin: Moderately severe disability     Balance Overall balance assessment: Needs assistance Sitting-balance support: Single extremity supported;Feet supported Sitting balance-Leahy Scale: Fair Sitting balance - Comments: can sit EOB without UE support or assist.   Standing balance support: Single extremity supported;During functional activity Standing balance-Leahy Scale: Poor Standing balance comment: Requires UE support for challenges but can stand statically without UE support for up to a minute.             High level balance activites: Turns;Direction changes;Sudden stops;Head turns High Level Balance Comments: min assist with challenges.  Daughter aware that pt will need 24 hour care iniitially.     Cognition Arousal/Alertness: Awake/alert Behavior During Therapy: WFL for tasks assessed/performed Overall Cognitive Status: Within Functional Limits for tasks assessed                      Exercises Other Exercises Other Exercises: Austin Miles - educated regarding exercise and handout given.     General Comments General comments (skin integrity, edema, etc.): daughter present.      Pertinent Vitals/Pain Pain Assessment: No/denies pain  VSS    Home Living                      Prior Function            PT Goals  (current goals can now be found in the care plan section) Progress towards PT goals: Progressing toward goals    Frequency  Min 4X/week    PT Plan Current plan remains appropriate    Co-evaluation             End of Session Equipment Utilized During Treatment: Gait belt Activity Tolerance: Patient tolerated treatment well Patient left: in chair;with call bell/phone within reach;with chair alarm set;with family/visitor present     Time: 1610-9604 PT Time Calculation (min) (ACUTE ONLY): 39 min  Charges:  $Gait Training: 8-22 mins $Therapeutic Exercise: 8-22 mins $Canalith Rep Proc: 8-22 mins                    G Codes:      Tawni Millers F 04/15/2015, 1:25 PM [employer] Acute Rehabilitation [phone] (pager)

## 2015-04-11 NOTE — Progress Notes (Addendum)
STROKE TEAM PROGRESS NOTE   HISTORY OF PRESENT ILLNESS Carolyn Hart is a 75 y.o. female who was in her normal state of health until 04/09/2015 when she took her husband to the doctor's office ([facility], exact time unknown but in the morning). She returned to the car to get something and then had an episode of loss of consciousness. The next thing she remembers was waking up in the ambulance. She does not remember anything that happened and unfortunately the witnesses to her collapse were not known to her. She had a hematoma on her right forehead. She has been dizzy which she describes as a sensation of disequilibrium since that time. She denies any history of staring spells, previous loss of consciousness, seizures, loss time. Premorbid modified rankin scale: 0. Patient was not administered IV t-PA secondary to stroke not initially suspected, mild symptoms. She was admitted for further evaluation and treatment.   SUBJECTIVE (INTERVAL HISTORY) Her daughter is at the bedside. Case manager also present setting up OP therapy.  Overall she feels her condition is resolved. She apparently had a syncope with retrograde amnesia. No seizure like activity and EEG negative. She did not feel well that day from sinus infection and did not check BP during the episode. On admission, her BP mildly low.    OBJECTIVE Temp:  [98 F (36.7 C)-98.9 F (37.2 C)] 98.6 F (37 C) (03/29 0721) Pulse Rate:  [65-104] 69 (03/29 0721) Cardiac Rhythm:  [-] Normal sinus rhythm (03/29 0700) Resp:  [18] 18 (03/29 0721) BP: (97-127)/(53-77) 118/62 mmHg (03/29 0721) SpO2:  [94 %-99 %] 94 % (03/29 0721)  CBC:   Recent Labs Lab 04/09/15 1400 04/11/15 0550  WBC 9.9 6.4  NEUTROABS 7.2  --   HGB 14.3 12.1  HCT 42.6 36.7  MCV 87.5 87.6  PLT 232 202    Basic Metabolic Panel:   Recent Labs Lab 04/09/15 1400 04/11/15 0550  NA 140 140  K 4.0 4.0  CL 105 105  CO2 24 27  GLUCOSE 130* 94  BUN 12 14  CREATININE 0.64 0.68   CALCIUM 9.4 8.8*    Lipid Panel:     Component Value Date/Time   CHOL 149 04/11/2015 0558   TRIG 69 04/11/2015 0558   HDL 59 04/11/2015 0558   CHOLHDL 2.5 04/11/2015 0558   VLDL 14 04/11/2015 0558   LDLCALC 76 04/11/2015 0558   HgbA1c: No results found for: HGBA1C Urine Drug Screen: No results found for: LABOPIA, COCAINSCRNUR, LABBENZ, AMPHETMU, THCU, LABBARB    IMAGING I have personally reviewed the radiological images below and agree with the radiology interpretations.  Dg Wrist Complete Right 04/09/2015  Acute slightly displaced, obliquely oriented, fracture at the base of the right fifth metacarpal bone. No obvious fracture extension to the articular surface.   Dg Knee 2 Views Right 04/09/2015  Normal right knee.   Ct Head Wo Contrast 04/09/2015  1. No acute intracranial pathology. 2. Right frontal scalp hematoma.   Ct Angio Head & Neck W/cm &/or Wo/cm 04/10/2015  No extracranial stenosis or intracranial stenosis of significance. No hemorrhage, vascular occlusion, or abnormal enhancement associated with the small LEFT posterior temporal CVA.   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 RIGHT frontal scalp hematoma without signs of extra-axial hematoma, shearing injury, or subarachnoid blood. Normal for age cerebral volume with moderately advanced chronic microvascular ischemic change.   Portable Chest  1 View 04/09/2015  No active disease.   Dg Hand Complete Right 04/09/2015  Fifth metacarpal fracture. Complaint L5 without was very suspicious always underlying with all daily The will make reading over without layer tips for communicating receive a yourself given 40 to work with the program to work with. We know. Heart: He he went. He is figured out or have severe the details ultrasonic  2D Echocardiogram  - Left ventricle: The cavity size was normal. There was  mild concentric hypertrophy. Systolic function was normal. The estimated ejection fraction was in the range of 60% to 65%. Wall motion was normal; there were no regional wall motio abnormalities. Doppler parameters are consistent with abnormal left ventricular relaxation (grade 1 diastolic dysfunction). - Aortic valve: Transvalvular velocity was within the normal range. There was no stenosis. There was no regurgitation. - Mitral valve: Transvalvular velocity was within the normal range. There was no evidence for stenosis. There was no regurgitation. - Right ventricle: The cavity size was normal. Wall thickness was normal. Systolic function was normal. - Atrial septum: No defect or patent foramen ovale was identified by color flow Doppler. - Tricuspid valve: There was mild regurgitation. - Pulmonary arteries: Systolic pressure was within the normal range. PA peak pressure: 26 mm Hg (S). - Inferior vena cava: The vessel was normal in size. The respirophasic diameter changes were in the normal range (>= 50%), consistent with normal central venous pressure.  EEG Unremarkable awake and drowsy routine inpatient EEG.    PHYSICAL EXAM  Temp:  [98.2 F (36.8 C)-98.6 F (37 C)] 98.2 F (36.8 C) (03/29 1323) Pulse Rate:  [69-74] 74 (03/29 1323) Resp:  [18] 18 (03/29 1323) BP: (109-118)/(59-62) 109/59 mmHg (03/29 1323) SpO2:  [94 %-96 %] 96 % (03/29 1323)  General - Well nourished, well developed, in no apparent distress.  Ophthalmologic - Sharp disc margins OU.   Cardiovascular - Regular rate and rhythm with no murmur.  Mental Status -  Level of arousal and orientation to time, place, and person were intact. Language including expression, naming, repetition, comprehension was assessed and found intact. Attention span and concentration were normal. Recent and remote memory were intact. Fund of Knowledge was assessed and was intact.  Cranial Nerves II - XII - II - Visual field intact  OU. III, IV, VI - Extraocular movements intact. V - Facial sensation intact bilaterally. VII - Facial movement intact bilaterally. VIII - Hearing & vestibular intact bilaterally. X - Palate elevates symmetrically. XI - Chin turning & shoulder shrug intact bilaterally. XII - Tongue protrusion intact.  Motor Strength - The patient's strength was normal in all extremities and pronator drift was absent.  Bulk was normal and fasciculations were absent.   Motor Tone - Muscle tone was assessed at the neck and appendages and was normal.  Reflexes - The patient's reflexes were 1+ in all extremities and she had no pathological reflexes.  Sensory - Light touch, temperature/pinprick were assessed and were symmetrical.    Coordination - The patient had normal movements in the hands and feet with no ataxia or dysmetria.  Tremor was absent.  Gait and Station - deferred due to safety concerns.   ASSESSMENT/PLAN [name] is a 75 y.o. female with history of hypertension and vertigo presenting with syncope with LOC. She did not receive IV t-PA due to stroke not suspected on arrival, mild symptoms.   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 Although hippocampal infarct has been showing to be related to memory loss, it is usually a larger lesion or bilateral lesions and usually with more profound memory disturbance. 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 Warren DanesXu's NP Carolyn Hart in 2 months, order written  Stroke team will sign off  Hypertension  Stable  Low yesterday, 90s - pt states she had not eaten  normalized today 120s  Hyperlipidemia  Home meds:  crestor 5, resumed in hospital  LDL 76, goal < 70  Continue statin at discharge  Other Stroke Risk Factors  Advanced age  Former Cigarette smoker, quit smoking 42 years ago   ETOH use  Other Active Problems  Syncope, workup underway  Fracture metacarpal  Scalp hematoma  Fibromyalgia   Hospital day # 1  Neurology will sign off. Please call with questions. Pt will follow up with [facility]yn Martin [facility] in about 2 months. Thanks for the consult.  Marvel PlanJindong Larrisa Cravey, MD PhD Stroke Neurology 04/12/2015 6:36 AM    To contact Stroke Continuity provider, please refer to [email or web address]. After hours, contact General Neurology

## 2015-04-11 NOTE — Care Management Note (Signed)
Case Management Note  Patient Details  Name: Carolyn Hart MRN: [id number] Date of Birth: 02/15/1940  Subjective/Objective:                 Spoke with patient and daughter at the bedside. Admitted with syncope. Independent PTA.    Action/Plan:  They would like to get a cane on their way home and follow up at [facility] for OP Vestibular PT. Referral placed through Epic. Patient given map to location. Info entered into AVS.  Expected Discharge Date:                  Expected Discharge Plan:  Home/Self Care  In-House Referral:     Discharge planning Services  CM Consult  Post Acute Care Choice:  NA Choice offered to:  Patient, Adult Children  DME Arranged:    DME Agency:     HH Arranged:    HH Agency:     Status of Service:  Completed, signed off  Medicare Important Message Given:    Date Medicare IM Given:    Medicare IM give by:    Date Additional Medicare IM Given:    Additional Medicare Important Message give by:     If discussed at Long Length of Stay Meetings, dates discussed:    Additional Comments:  Lawerance SabalDebbie Callahan Peddie, RN 04/11/2015, 11:32 AM

## 2015-04-11 NOTE — Discharge Instructions (Signed)
FOLLOW UP WITH DR MURPHY IN 1-2 WEEKS FOR right fifth metacarpal fracture

## 2015-04-11 NOTE — Discharge Summary (Addendum)
Physician Discharge Summary  Carolyn Hart MRN: [id number] DOB/AGE: 1940-10-12 75 y.o.  PCP: Marton Redwood, MD   Admit date: 04/09/2015 Discharge date: 04/11/2015  Discharge Diagnoses:   Principal Problem:   Syncope Active Problems:   Depression   Hypertension   Fx metacarpal   Scalp hematoma   Closed fracture of 5th metacarpal   Essential hypertension   Fall   Syncope and collapse   Acute CVA (cerebrovascular accident) ([place])    Follow-up recommendations Follow-up with PCP in 3-5 days , including all  additional recommended appointments as below Follow-up CBC, CMP in 3-5 days FOLLOW UP WITH DR Percell Miller IN 1-2 WEEKS FOR right fifth metacarpal fracture     Medication List    TAKE these medications        cetirizine 10 MG tablet  Commonly known as:  ZYRTEC  Take 10 mg by mouth daily as needed for allergies.     cholecalciferol 1000 units tablet  Commonly known as:  VITAMIN D  Take 1,000 Units by mouth daily.     clopidogrel 75 MG tablet  Commonly known as:  PLAVIX  Take 1 tablet (75 mg total) by mouth daily.     HYDROcodone-acetaminophen 5-325 MG tablet  Commonly known as:  NORCO/VICODIN  Take 1 tablet by mouth every 4 (four) hours as needed for moderate pain.     levofloxacin 500 MG tablet  Commonly known as:  LEVAQUIN  take 1 tablet by mouth once daily with food     losartan 25 MG tablet  Commonly known as:  COZAAR  Take 12.5 mg by mouth daily.     meclizine 12.5 MG tablet  Commonly known as:  ANTIVERT  Take 1 tablet (12.5 mg total) by mouth 3 (three) times daily as needed for dizziness.     multivitamin tablet  Take 1 tablet by mouth daily.     rosuvastatin 10 MG tablet  Commonly known as:  CRESTOR  Take 5 mg by mouth daily.     sertraline 100 MG tablet  Commonly known as:  ZOLOFT  Take 100 mg by mouth daily.     SYSTANE BALANCE 0.6 % Soln  Generic drug:  Propylene Glycol  Place 1 drop into both eyes daily.         Discharge Condition:  Stable Discharge Instructions Get Medicines reviewed and adjusted: Please take all your medications with you for your next visit with your Primary MD  Please request your Primary MD to go over all hospital tests and procedure/radiological results at the follow up, please ask your Primary MD to get all Hospital records sent to his/her office.  If you experience worsening of your admission symptoms, develop shortness of breath, life threatening emergency, suicidal or homicidal thoughts you must seek medical attention immediately by calling 911 or calling your MD immediately if symptoms less severe.  You must read complete instructions/literature along with all the possible adverse reactions/side effects for all the Medicines you take and that have been prescribed to you. Take any new Medicines after you have completely understood and accpet all the possible adverse reactions/side effects.   Do not drive when taking Pain medications.   Do not take more than prescribed Pain, Sleep and Anxiety Medications  Special Instructions: If you have smoked or chewed Tobacco in the last 2 yrs please stop smoking, stop any regular Alcohol and or any Recreational drug use.  Wear Seat belts while driving.  Please note  You were cared for by  a hospitalist during your hospital stay. Once you are discharged, your primary care physician will handle any further medical issues. Please note that NO REFILLS for any discharge medications will be authorized once you are discharged, as it is imperative that you return to your primary care physician (or establish a relationship with a primary care physician if you do not have one) for your aftercare needs so that they can reassess your need for medications and monitor your lab values.    Allergies  Allergen Reactions  . Penicillins Hives and Shortness Of Breath  . Tetracyclines & Related Hives and Nausea Only  . Codeine Nausea Only      Disposition: 01-Home or  Self Care   Consults:  Neurology     Significant Diagnostic Studies:  Ct Angio Head W/cm &/or Wo Cm  04/10/2015  CLINICAL DATA:  Embolic stroke?  Vertigo with syncope. EXAM: CT ANGIOGRAPHY HEAD AND NECK TECHNIQUE: Multidetector CT imaging of the head and neck was performed using the standard protocol during bolus administration of intravenous contrast. Multiplanar CT image reconstructions and MIPs were obtained to evaluate the vascular anatomy. Carotid stenosis measurements (when applicable) are obtained utilizing NASCET criteria, using the distal internal carotid diameter as the denominator. CONTRAST:  19m OMNIPAQUE IOHEXOL 350 MG/ML SOLN COMPARISON:  MR brain 04/10/2015.  CT head 04/09/2015. FINDINGS: CT HEAD Calvarium and skull base: No fracture or destructive lesion. Mastoids and middle ears are grossly clear. RIGHT frontal scalp hematoma redemonstrated. Paranasal sinuses: Imaged portions are clear. Orbits: Negative. Brain: No evidence of acute abnormality, including acute infarct, hemorrhage, hydrocephalus, or mass lesion. Moderate small vessel disease. The acute infarct is not visible on CT.No contrecoup injury or extra-axial fluid. CTA NECK Aortic arch: Standard branching. Imaged portion shows no evidence of aneurysm or dissection. No significant stenosis of the major arch vessel origins. Right carotid system: Minor atheromatous change. No evidence of dissection, stenosis (50% or greater) or occlusion. Left carotid system: Minor atheromatous change. No evidence of dissection, stenosis (50% or greater) or occlusion. Vertebral arteries: Codominant. No evidence of dissection, stenosis (50% or greater) or occlusion. Nonvascular soft tissues:  No acute or worrisome findings. CTA HEAD Anterior circulation: Minor cavernous atheromatous change. No significant stenosis, proximal occlusion, aneurysm, or vascular malformation. Posterior circulation: RIGHT vertebral dominant but both patent. No PCA stenosis  or occlusion. No significant stenosis, proximal occlusion, aneurysm, or vascular malformation. Venous sinuses: As permitted by contrast timing, patent. Anatomic variants: Fetal RIGHT PCA. Delayed phase:   No abnormal intracranial enhancement. IMPRESSION: No extracranial stenosis or intracranial stenosis of significance. No hemorrhage, vascular occlusion, or abnormal enhancement associated with the small LEFT posterior temporal CVA. Electronically Signed   By: JStaci RighterM.D.   On: 04/10/2015 21:36   Dg Wrist Complete Right  04/09/2015  CLINICAL DATA:  Fall after syncope EXAM: RIGHT WRIST - COMPLETE 3+ VIEW COMPARISON:  None. FINDINGS: There is a slightly displaced fracture at the base of the right fifth metacarpal bone, without obvious extension to the articular surface. Carpal bones appear intact and normally aligned. Small dense chronic-appearing calcification overlying the triquetrum, presumably related to previous injury. IMPRESSION: Acute slightly displaced, obliquely oriented, fracture at the base of the right fifth metacarpal bone. No obvious fracture extension to the articular surface. Electronically Signed   By: SFranki CabotM.D.   On: 04/09/2015 13:43   Dg Knee 2 Views Right  04/09/2015  CLINICAL DATA:  Right knee pain after fall.  Initial encounter. EXAM: RIGHT KNEE - 1-2  VIEW COMPARISON:  None. FINDINGS: There is no evidence of fracture, dislocation, or joint effusion. There is no evidence of arthropathy or other focal bone abnormality. Soft tissues are unremarkable. IMPRESSION: Normal right knee. Electronically Signed   By: Marijo Conception, M.D.   On: 04/09/2015 13:41   Ct Head Wo Contrast  04/09/2015  CLINICAL DATA:  Status post fall.  Positive loss of consciousness. EXAM: CT HEAD WITHOUT CONTRAST TECHNIQUE: Contiguous axial images were obtained from the base of the skull through the vertex without intravenous contrast. COMPARISON:  10/03/2012 FINDINGS: There is no evidence of mass effect,  midline shift, or extra-axial fluid collections. There is no evidence of a space-occupying lesion or intracranial hemorrhage. There is no evidence of a cortical-based area of acute infarction. There is generalized cerebral atrophy. There is periventricular white matter low attenuation likely secondary to microangiopathy. The ventricles and sulci are appropriate for the patient's age. The basal cisterns are patent. Visualized portions of the orbits are unremarkable. The visualized portions of the paranasal sinuses and mastoid air cells are unremarkable. Cerebrovascular atherosclerotic calcifications are noted. The osseous structures are unremarkable. There is a right frontal scalp hematoma. IMPRESSION: 1. No acute intracranial pathology. 2. Right frontal scalp hematoma. Electronically Signed   By: Kathreen Devoid   On: 04/09/2015 13:16   Ct Angio Neck W/cm &/or Wo/cm  04/10/2015  CLINICAL DATA:  Embolic stroke?  Vertigo with syncope. EXAM: CT ANGIOGRAPHY HEAD AND NECK TECHNIQUE: Multidetector CT imaging of the head and neck was performed using the standard protocol during bolus administration of intravenous contrast. Multiplanar CT image reconstructions and MIPs were obtained to evaluate the vascular anatomy. Carotid stenosis measurements (when applicable) are obtained utilizing NASCET criteria, using the distal internal carotid diameter as the denominator. CONTRAST:  86m OMNIPAQUE IOHEXOL 350 MG/ML SOLN COMPARISON:  MR brain 04/10/2015.  CT head 04/09/2015. FINDINGS: CT HEAD Calvarium and skull base: No fracture or destructive lesion. Mastoids and middle ears are grossly clear. RIGHT frontal scalp hematoma redemonstrated. Paranasal sinuses: Imaged portions are clear. Orbits: Negative. Brain: No evidence of acute abnormality, including acute infarct, hemorrhage, hydrocephalus, or mass lesion. Moderate small vessel disease. The acute infarct is not visible on CT.No contrecoup injury or extra-axial fluid. CTA NECK  Aortic arch: Standard branching. Imaged portion shows no evidence of aneurysm or dissection. No significant stenosis of the major arch vessel origins. Right carotid system: Minor atheromatous change. No evidence of dissection, stenosis (50% or greater) or occlusion. Left carotid system: Minor atheromatous change. No evidence of dissection, stenosis (50% or greater) or occlusion. Vertebral arteries: Codominant. No evidence of dissection, stenosis (50% or greater) or occlusion. Nonvascular soft tissues:  No acute or worrisome findings. CTA HEAD Anterior circulation: Minor cavernous atheromatous change. No significant stenosis, proximal occlusion, aneurysm, or vascular malformation. Posterior circulation: RIGHT vertebral dominant but both patent. No PCA stenosis or occlusion. No significant stenosis, proximal occlusion, aneurysm, or vascular malformation. Venous sinuses: As permitted by contrast timing, patent. Anatomic variants: Fetal RIGHT PCA. Delayed phase:   No abnormal intracranial enhancement. IMPRESSION: No extracranial stenosis or intracranial stenosis of significance. No hemorrhage, vascular occlusion, or abnormal enhancement associated with the small LEFT posterior temporal CVA. Electronically Signed   By: JStaci RighterM.D.   On: 04/10/2015 21:36   Mr Brain Wo Contrast  04/10/2015  CLINICAL DATA:  Patient fell 04/09/2015 and hit head. Large hematoma. Syncopal episode. Vertigo. Evaluate for stroke. EXAM: MRI HEAD WITHOUT CONTRAST TECHNIQUE: Multiplanar, multiecho pulse sequences of the brain and  surrounding structures were obtained without intravenous contrast. COMPARISON:  CT head 04/09/2015. FINDINGS: There is a subcentimeter area of restricted diffusion, posterior hippocampus, LEFT temporal lobe, confirmed on axial and coronal DWI with low ADC, consistent with a small area of acute infarction. LEFT PCA territory. No hemorrhage, mass lesion, hydrocephalus, or extra-axial fluid. No subarachnoid blood on  FLAIR imaging. No contrecoup injury to the brain given the large RIGHT frontal scalp hematoma. Normal for age cerebral volume. Moderately advanced T2 and FLAIR hyperintensities throughout the white matter, likely chronic microvascular ischemic change, given the patient's history of hypertension and high cholesterol. No midline abnormality. No temporal bone abnormality. Flow voids are maintained in the carotid, basilar, and both vertebral arteries. Gradient sequence demonstrates no foci of chronic hemorrhage. 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 RIGHT frontal scalp hematoma without signs of extra-axial hematoma, shearing injury, or subarachnoid blood. Normal for age cerebral volume with moderately advanced chronic microvascular ischemic change. Electronically Signed   By: Staci Righter M.D.   On: 04/10/2015 13:19   Portable Chest 1 View  04/09/2015  CLINICAL DATA:  Syncope, no other chest complaints EXAM: PORTABLE CHEST 1 VIEW COMPARISON:  08/16/2003 FINDINGS: The heart size and mediastinal contours are within normal limits. Both lungs are clear. The visualized skeletal structures are unremarkable. IMPRESSION: No active disease. Electronically Signed   By: Kathreen Devoid   On: 04/09/2015 15:24   Dg Hand Complete Right  04/09/2015  CLINICAL DATA:  Recent fall with right hand pain, initial encounter EXAM: RIGHT HAND - COMPLETE 3+ VIEW COMPARISON:  None. FINDINGS: There is an oblique fracture through the base of the fifth metacarpal. Mild impaction and angulation at the fracture site is noted. No gross soft tissue abnormality is seen. IMPRESSION: Fifth metacarpal fracture. Electronically Signed   By: Inez Catalina M.D.   On: 04/09/2015 13:41    2-D echo LV EF: 60% - 65%  ------------------------------------------------------------------- Indications: Syncope  780.2.  ------------------------------------------------------------------- History: Risk factors: Hypertension. Dyslipidemia.  ------------------------------------------------------------------- Study Conclusions  - Left ventricle: The cavity size was normal. There was mild  concentric hypertrophy. Systolic function was normal. The  estimated ejection fraction was in the range of 60% to 65%. Wall  motion was normal; there were no regional wall motion  abnormalities. Doppler parameters are consistent with abnormal  left ventricular relaxation (grade 1 diastolic dysfunction). - Aortic valve: Transvalvular velocity was within the normal range.  There was no stenosis. There was no regurgitation. - Mitral valve: Transvalvular velocity was within the normal range.  There was no evidence for stenosis. There was no regurgitation. - Right ventricle: The cavity size was normal. Wall thickness was  normal. Systolic function was normal. - Atrial septum: No defect or patent foramen ovale was identified  by color flow Doppler. - Tricuspid valve: There was mild regurgitation. - Pulmonary arteries: Systolic pressure was within the normal  range. PA peak pressure: 26 mm Hg (S). - Inferior vena cava: The vessel was normal in size. The  respirophasic diameter changes were in the normal range (>= 50%),  consistent with normal central venous pressure.    Filed Weights   04/09/15 1211 04/09/15 1808 04/10/15 0504  Weight: 58.968 kg (130 lb) 63.458 kg (139 lb 14.4 oz) 62.1 kg (136 lb 14.5 oz)     Microbiology: No results found for this or any previous visit (from the past 240 hour(s)).     Blood Culture No results  found for: SDES, [place], [place], REPTSTATUS    Labs: Results for orders placed or performed during the hospital encounter of 04/09/15 (from the past 48 hour(s))  CBC with Differential     Status: None   Collection Time: 04/09/15  2:00 PM  Result Value Ref  Range   WBC 9.9 4.0 - 10.5 K/uL   RBC 4.87 3.87 - 5.11 MIL/uL   Hemoglobin 14.3 12.0 - 15.0 g/dL   HCT 42.6 36.0 - 46.0 %   MCV 87.5 78.0 - 100.0 fL   MCH 29.4 26.0 - 34.0 pg   MCHC 33.6 30.0 - 36.0 g/dL   RDW 13.2 11.5 - 15.5 %   Platelets 232 150 - 400 K/uL   Neutrophils Relative % 73 %   Neutro Abs 7.2 1.7 - 7.7 K/uL   Lymphocytes Relative 21 %   Lymphs Abs 2.1 0.7 - 4.0 K/uL   Monocytes Relative 5 %   Monocytes Absolute 0.5 0.1 - 1.0 K/uL   Eosinophils Relative 1 %   Eosinophils Absolute 0.1 0.0 - 0.7 K/uL   Basophils Relative 0 %   Basophils Absolute 0.0 0.0 - 0.1 K/uL  Basic metabolic panel     Status: Abnormal   Collection Time: 04/09/15  2:00 PM  Result Value Ref Range   Sodium 140 135 - 145 mmol/L   Potassium 4.0 3.5 - 5.1 mmol/L   Chloride 105 101 - 111 mmol/L   CO2 24 22 - 32 mmol/L   Glucose, Bld 130 (H) 65 - 99 mg/dL   BUN 12 6 - 20 mg/dL   Creatinine, Ser 0.64 0.44 - 1.00 mg/dL   Calcium 9.4 8.9 - 10.3 mg/dL   GFR calc non Af Amer >60 >60 mL/min   GFR calc Af Amer >60 >60 mL/min    Comment: (NOTE) The eGFR has been calculated using the CKD EPI equation. This calculation has not been validated in all clinical situations. eGFR's persistently <60 mL/min signify possible Chronic Kidney Disease.    Anion gap 11 5 - 15  I-stat troponin, ED     Status: None   Collection Time: 04/09/15  2:10 PM  Result Value Ref Range   Troponin i, poc 0.00 0.00 - 0.08 ng/mL   Comment 3            Comment: Due to the release kinetics of cTnI, a negative result within the first hours of the onset of symptoms does not rule out myocardial infarction with certainty. If myocardial infarction is still suspected, repeat the test at appropriate intervals.   Urinalysis, Routine w reflex microscopic (not at [facility])     Status: Abnormal   Collection Time: 04/09/15  5:50 PM  Result Value Ref Range   Color, Urine YELLOW YELLOW   APPearance CLOUDY (A) CLEAR   Specific Gravity, Urine  1.018 1.005 - 1.030   pH 7.0 5.0 - 8.0   Glucose, UA NEGATIVE NEGATIVE mg/dL   Hgb urine dipstick NEGATIVE NEGATIVE   Bilirubin Urine NEGATIVE NEGATIVE   Ketones, ur NEGATIVE NEGATIVE mg/dL   Protein, ur NEGATIVE NEGATIVE mg/dL   Nitrite NEGATIVE NEGATIVE   Leukocytes, UA NEGATIVE NEGATIVE    Comment: MICROSCOPIC NOT DONE ON URINES WITH NEGATIVE PROTEIN, BLOOD, LEUKOCYTES, NITRITE, OR GLUCOSE <1000 mg/dL.  TSH     Status: None   Collection Time: 04/09/15  6:23 PM  Result Value Ref Range   TSH 1.530 0.350 - 4.500 uIU/mL  Troponin I     Status: None  Collection Time: 04/09/15  6:23 PM  Result Value Ref Range   Troponin I <0.03 <0.031 ng/mL    Comment:        NO INDICATION OF MYOCARDIAL INJURY.   Glucose, capillary     Status: None   Collection Time: 04/10/15  5:08 AM  Result Value Ref Range   Glucose-Capillary 91 65 - 99 mg/dL  Troponin I     Status: None   Collection Time: 04/10/15  5:18 AM  Result Value Ref Range   Troponin I <0.03 <0.031 ng/mL    Comment:        NO INDICATION OF MYOCARDIAL INJURY.   Comprehensive metabolic panel     Status: Abnormal   Collection Time: 04/11/15  5:50 AM  Result Value Ref Range   Sodium 140 135 - 145 mmol/L   Potassium 4.0 3.5 - 5.1 mmol/L   Chloride 105 101 - 111 mmol/L   CO2 27 22 - 32 mmol/L   Glucose, Bld 94 65 - 99 mg/dL   BUN 14 6 - 20 mg/dL   Creatinine, Ser 0.68 0.44 - 1.00 mg/dL   Calcium 8.8 (L) 8.9 - 10.3 mg/dL   Total Protein 5.8 (L) 6.5 - 8.1 g/dL   Albumin 3.4 (L) 3.5 - 5.0 g/dL   AST 17 15 - 41 U/L   ALT 11 (L) 14 - 54 U/L   Alkaline Phosphatase 51 38 - 126 U/L   Total Bilirubin 0.6 0.3 - 1.2 mg/dL   GFR calc non Af Amer >60 >60 mL/min   GFR calc Af Amer >60 >60 mL/min    Comment: (NOTE) The eGFR has been calculated using the CKD EPI equation. This calculation has not been validated in all clinical situations. eGFR's persistently <60 mL/min signify possible Chronic Kidney Disease.    Anion gap 8 5 - 15  CBC      Status: None   Collection Time: 04/11/15  5:50 AM  Result Value Ref Range   WBC 6.4 4.0 - 10.5 K/uL   RBC 4.19 3.87 - 5.11 MIL/uL   Hemoglobin 12.1 12.0 - 15.0 g/dL   HCT 36.7 36.0 - 46.0 %   MCV 87.6 78.0 - 100.0 fL   MCH 28.9 26.0 - 34.0 pg   MCHC 33.0 30.0 - 36.0 g/dL   RDW 13.1 11.5 - 15.5 %   Platelets 202 150 - 400 K/uL  Vitamin B12     Status: None   Collection Time: 04/11/15  5:50 AM  Result Value Ref Range   Vitamin B-12 275 180 - 914 pg/mL    Comment: (NOTE) This assay is not validated for testing neonatal or myeloproliferative syndrome specimens for Vitamin B12 levels.   Lipid panel     Status: None   Collection Time: 04/11/15  5:58 AM  Result Value Ref Range   Cholesterol 149 0 - 200 mg/dL   Triglycerides 69 <150 mg/dL   HDL 59 >40 mg/dL   Total CHOL/HDL Ratio 2.5 RATIO   VLDL 14 0 - 40 mg/dL   LDL Cholesterol 76 0 - 99 mg/dL    Comment:        Total Cholesterol/HDL:CHD Risk Coronary Heart Disease Risk Table                     Men   Women  1/2 Average Risk   3.4   3.3  Average Risk       5.0   4.4  2 X Average  Risk   9.6   7.1  3 X Average Risk  23.4   11.0        Use the calculated Patient Ratio above and the CHD Risk Table to determine the patient's CHD Risk.        ATP III CLASSIFICATION (LDL):  <100     mg/dL   Optimal  100-129  mg/dL   Near or Above                    Optimal  130-159  mg/dL   Borderline  160-189  mg/dL   High  >190     mg/dL   Very High   Folate     Status: None   Collection Time: 04/11/15  5:58 AM  Result Value Ref Range   Folate 12.4 >5.9 ng/mL  Glucose, capillary     Status: None   Collection Time: 04/11/15  8:00 AM  Result Value Ref Range   Glucose-Capillary 95 65 - 99 mg/dL     Lipid Panel     Component Value Date/Time   CHOL 149 04/11/2015 0558   TRIG 69 04/11/2015 0558   HDL 59 04/11/2015 0558   CHOLHDL 2.5 04/11/2015 0558   VLDL 14 04/11/2015 0558   LDLCALC 76 04/11/2015 0558     No results found  for: HGBA1C   Lab Results  Component Value Date   LDLCALC 76 04/11/2015   CREATININE 0.68 04/11/2015     HPI :*Carolyn Hart is a delightful 75 y.o. female with a past medical history includes hypertension, vertigo [place] to the emergency department from her PCPs office with the chief complaint of syncope. Initial evaluation reveals fifth metacarpal fracture scalp hematoma.  Information is obtained from the chart and the patient and the daughter who is at the bedside. Patient reports she was taking her husband to his PCP. She was returning to the car to retrieve forgotten item and her last memory is opening the door for an elderly gentleman and then her next memory is waking up in an ambulance. Reportedly she was found next to her car on the ground in the parking lot. She reports being diagnosed recently with a sinus infection has been taking Levaquin for 5 days and generally "not feeling very well". She states she has taken Levaquin in the past without problems. Associated symptoms include persistent nausea no vomiting but decreased oral intake. She denies any chest pain palpitations cough fever chills abdominal pain diarrhea dysuria hematuria frequency or urgency. She denies any lower extremity edema or orthopnea. She denies headache visual disturbances dizziness. Since she presented to the emergency department she has experienced intermittent nausea  HOSPITAL COURSE:   #1. Syncope. Likely secondary to seizure and stroke onset recent diagnosis of sinus infection on Levaquin, related to decreased oral intake. Orthostatic syncope VS seizure.CT the head without acute abnormality. UA, chest x-ray negative. Complete metabolic panel unremarkable initial troponin negative EKG without acute changes. On admission she's afebrile hemodynamically stable and nontoxic appearing Telemetry uneventful, cardiac enzymes negative 3  2-D echo with results as above TSH 1.53  CT and do does not show any  extracranial stenosis or intracranial stenosis of significance MRI of the brain shows acute left posterior temporal lobe hippocampal infarct EEG adult unremarkable Patient started on Plavix by Dr. Katherine Roan LDL 76-we'll start the patient on statin Hemoglobin A1c pending  #2. Extra metacarpal. Per x-ray -Splint applied per ED -Analgesia -Eyes -Elevate -Outpatient follow-up with orthopedics, Dr. Christia Reading  Murphy to see in one to 2 weeks   #3. Scalp hematoma. CT of the head without acute changes. No laceration noted - cold Compress -Analgesia -Up with assistance  4. Hypertension. Controlled in the emergency department. Home medications include losartan.  -We'll continue losartan  Discharge Exam:  Blood pressure 118/62, pulse 69, temperature 98.6 F (37 C), temperature source Oral, resp. rate 18, height 5' 1"  (1.549 m), weight 62.1 kg (136 lb 14.5 oz), last menstrual period 01/13/2002, SpO2 94 %.  General: No acute respiratory distress Lungs: Clear to auscultation bilaterally without wheezes or crackles Cardiovascular: Regular rate and rhythm without murmur gallop or rub normal S1 and S2 Abdomen: Nontender, nondistended, soft, bowel sounds positive, no rebound, no ascites, no appreciable mass Extremities: No significant cyanosis, clubbing, or edema bilateral lower extremities        Follow-up Information    Follow up with Marton Redwood, MD. Schedule an appointment as soon as possible for a visit in 1 week.   Specialty:  Internal Medicine   Contact information:   [address] [place] [address] [phone]       Follow up with Renette Butters, MD. Schedule an appointment as soon as possible for a visit in 1 week.   Specialty:  Orthopedic Surgery   Contact information:   [address] [place] [address] [phone]       Signed: Reyne Dumas 04/11/2015, 9:17 AM        Time spent >45 mins

## 2015-04-11 NOTE — Progress Notes (Signed)
Orthopedic Tech Progress Note Patient Details:  Vickii PennaJoyce H Robillard 08/24/1940 [id number]  Ortho Devices Type of Ortho Device: Arm sling Ortho Device/Splint Location: rue Ortho Device/Splint Interventions: Application   Nikki DomCrawford, Usiel Astarita 04/11/2015, 3:12 PM

## 2015-04-11 NOTE — Progress Notes (Signed)
Pt given discharge instructions, prescriptions, and care notes. Pt verbalized understanding AEB no further questions or concerns at this time. IV was discontinued, no redness, pain, or swelling noted at this time. Telemetry discontinued and Centralized Telemetry was notified. Pt left the floor via wheelchair with staff in stable condition. 

## 2015-04-11 NOTE — Consult Note (Signed)
Neurology Consultation Reason for Consult: Stroke Referring Physician: Jeanella Anton  CC: Syncope  History is obtained from: Patient  HPI: Carolyn Hart is a 75 y.o. female who was in her normal state of health until 3/27 when she took her husband to the doctor's office. She returned to the car to get something and then had an episode of loss of consciousness. The next thing she remembers was waking up in the ambulance. She does not remember anything that happened and unfortunately the witnesses to her collapse were not known to her. She had a hematoma on her right forehead. She has been dizzy which she describes as a sensation of disequilibrium since that time.  She denies any history of staring spells, previous loss of consciousness, seizures, loss time.   LKW: 3/27, uncertain time but in the morning tpa given?: no, stroke not initially suspected, mild symptoms Premorbid modified rankin scale: 0  ROS: A 14 point ROS was performed and is negative except as noted in the HPI.   Past Medical History  Diagnosis Date  . Depression   . Hypertension   . Osteopenia   . Urinary incontinence   . Vertigo   . Acute ear infection 04/09/2015  . Syncope     03/2015  . Fx metacarpal     03/2015  . High cholesterol   . Pneumonia ~ 2014  . Chronic bronchitis (HCC)     "get it ~ q other year" (04/09/2015)  . Sinus headache   . Fibromyalgia   . Anxiety      Family History  Problem Relation Age of Onset  . Heart disease Mother   . Heart failure Mother   . Heart disease Father   . Heart failure Father   . Heart disease Brother   . Heart failure Brother   . Heart failure Brother      Social History:  reports that she quit smoking about 42 years ago. Her smoking use included Cigarettes. She has a 2.5 pack-year smoking history. She has never used smokeless tobacco. She reports that she drinks about 2.4 oz of alcohol per week. She reports that she does not use illicit drugs.   Exam: Current  vital signs: BP 105/54 mmHg  Pulse 72  Temp(Src) 98.5 F (36.9 C) (Oral)  Resp 18  Ht  (1.549 m)  Wt 62.1 kg (136 lb 14.5 oz)  BMI 25.88 kg/m2  SpO2 96%  LMP 01/13/2002 Vital signs in last 24 hours: Temp:  [98 F (36.7 C)-98.5 F (36.9 C)] 98.5 F (36.9 C) (03/28 2159) Pulse Rate:  [72-104] 72 (03/28 2201) Resp:  [15-18] 18 (03/28 2159) BP: (97-124)/(53-63) 105/54 mmHg (03/28 2201) SpO2:  [94 %-99 %] 96 % (03/28 2159) Weight:  [62.1 kg (136 lb 14.5 oz)] 62.1 kg (136 lb 14.5 oz) (03/28 0504)   Physical Exam  Constitutional: Appears well-developed and well-nourished.  Psych: Affect appropriate to situation Eyes: No scleral injection HENT: No OP obstrucion Head: Normocephalic.  Cardiovascular: Normal rate and regular rhythm.  Respiratory: Effort normal and breath sounds normal to anterior ascultation GI: Soft.  No distension. There is no tenderness.  Skin: WDI  Neuro: Mental Status: Patient is awake, alert, oriented to person, place, month, year, and situation. Patient is able to give a clear and coherent history. No signs of aphasia or neglect Cranial Nerves: II: Visual Fields are full. Pupils are equal, round, and reactive to light.   III,IV, VI: EOMI without ptosis or diploplia.  V:  Facial sensation is symmetric to temperature VII: Facial movement is symmetric.  VIII: hearing is intact to voice X: Uvula elevates symmetrically XI: Shoulder shrug is symmetric. XII: tongue is midline without atrophy or fasciculations.  Motor: Tone is normal. Bulk is normal. 5/5 strength was present in all four extremities though it is limited in her right arm due to recent injury Sensory: Sensation is symmetric to light touch and temperature in the arms and legs. Cerebellar: FNF  intact bilaterally         I have reviewed labs in epic and the results pertinent to this consultation are: BMP-unremarkable  I have reviewed the images obtained: MRI brain, tiny focal area  of infarction in the left  Impression: 75 year old female with tiny focal infarction. It is unusual for stroke call syncope. Possible etiologies would be that she had a mechanical fall due to TIA with subsequent concussion, initial larger area of ischemia that subsequently cleared with only a small residual infarct, seizure at stroke onset. In any case, she does need further workup to further look for the audiology of stroke.  Recommendations: 1) CT angiogram of the head and neck ending 2) LDL, A1c 3) echocardiogram showed no embolic source 4) PT 5) telemetry 6) stroke team to follow   Ritta SlotMcNeill Kirkpatrick, MD [facility] [phone]  If 7pm- 7am, please page neurology on call as listed in AMION.

## 2015-04-12 DIAGNOSIS — E785 Hyperlipidemia, unspecified: Secondary | ICD-10-CM | POA: Insufficient documentation

## 2015-04-12 LAB — HEMOGLOBIN A1C
Hgb A1c MFr Bld: 5.5 % (ref 4.8–5.6)
Mean Plasma Glucose: 111 mg/dL

## 2015-05-15 ENCOUNTER — Ambulatory Visit: Payer: Medicare Other | Admitting: Nurse Practitioner

## 2015-05-16 ENCOUNTER — Ambulatory Visit (INDEPENDENT_AMBULATORY_CARE_PROVIDER_SITE_OTHER): Payer: Medicare Other | Admitting: Neurology

## 2015-05-16 ENCOUNTER — Encounter: Payer: Self-pay | Admitting: Neurology

## 2015-05-16 VITALS — BP 106/70 | HR 72 | Ht 59.0 in | Wt 135.6 lb

## 2015-05-16 DIAGNOSIS — R55 Syncope and collapse: Secondary | ICD-10-CM | POA: Diagnosis not present

## 2015-05-16 DIAGNOSIS — I63332 Cerebral infarction due to thrombosis of left posterior cerebral artery: Secondary | ICD-10-CM | POA: Diagnosis not present

## 2015-05-16 DIAGNOSIS — E785 Hyperlipidemia, unspecified: Secondary | ICD-10-CM

## 2015-05-16 DIAGNOSIS — I10 Essential (primary) hypertension: Secondary | ICD-10-CM | POA: Diagnosis not present

## 2015-05-16 NOTE — Progress Notes (Signed)
STROKE NEUROLOGY FOLLOW UP NOTE  NAME: Carolyn Hart DOB: 11/14/1940  REASON FOR VISIT: stroke follow up HISTORY FROM: pt and chart  Today we had the pleasure of seeing Carolyn Hart in follow-up at our Neurology Clinic. Pt was accompanied by friend.   History Summary [name] is a 75 y.o. female with history of hypertension and vertigo admitted on 04/09/15 for syncope with LOC and retrograde amnesia. Her syncope most likely vasovagal and BP low on admission. TTE unremarkable and tele no arrhythmia. 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 Although hippocampal infarct has been showing to be related to memory loss, it is usually a larger lesion or bilateral lesions and usually with more profound memory disturbance. Infarct most likely secondary to small vessel disease source. CTA head and neck unremarkable. EEG unremarkable and LDL 76 and A1C 5.5. He was put on plavix and crestor 5mg  on discharge.   Interval History During the interval time, the patient has been doing well. Still has right hand brace due to fracture prior to last admission. She stopped plavix by her self and changed to ASA 81mg . Continued on crestor. BP today on the low side 106/70. Not check BP at home. Otherwise, no complains.     REVIEW OF SYSTEMS: Full 14 system review of systems performed and notable only for those listed below and in HPI above, all others are negative:  Constitutional:  fatigue Cardiovascular:  Ear/Nose/Throat:   Skin:  Eyes:   Respiratory:   Gastroitestinal:   Genitourinary:  Hematology/Lymphatic:   Endocrine:  Musculoskeletal:  Aching muscles Allergy/Immunology:  Allergy, frequent infection Neurological:   Psychiatric:  Sleep:   The following represents the patient's updated allergies and side effects list: Allergies  Allergen Reactions  . Penicillins Hives and Shortness Of Breath  . Tetracyclines & Related  Hives and Nausea Only  . Codeine Nausea Only    The neurologically relevant items on the patient's problem list were reviewed on today's visit.  Neurologic Examination  A problem focused neurological exam (12 or more points of the single system neurologic examination, vital signs counts as 1 point, cranial nerves count for 8 points) was performed.  Blood pressure 106/70, pulse 72, height 4\' 11"  (1.499 m), weight 135 lb 9.6 oz (61.508 kg), last menstrual period 01/13/2002.  General - Well nourished, well developed, in no apparent distress.  Ophthalmologic - Sharp disc margins OU.   Cardiovascular - Regular rate and rhythm with no murmur.  Mental Status -  Level of arousal and orientation to time, place, and person were intact. Language including expression, naming, repetition, comprehension was assessed and found intact. Attention span and concentration were normal. Recent and remote memory were intact. Fund of Knowledge was assessed and was intact.  Cranial Nerves II - XII - II - Visual field intact OU. III, IV, VI - Extraocular movements intact. V - Facial sensation intact bilaterally. VII - Facial movement intact bilaterally. VIII - Hearing & vestibular intact bilaterally. X - Palate elevates symmetrically. XI - Chin turning & shoulder shrug intact bilaterally. XII - Tongue protrusion intact.  Motor Strength - The patient's strength was normal in all extremities except right hand brace and not able to check right hand grip and pronator drift was absent.  Bulk was normal and fasciculations were absent.   Motor Tone - Muscle tone was assessed at the neck and appendages and was normal.  Reflexes - The patient's reflexes were  1+ in all extremities and she had no pathological reflexes.  Sensory - Light touch, temperature/pinprick were assessed and were normal.    Coordination - The patient had normal movements in the hands and feet with no ataxia or dysmetria.  Tremor was  absent.  Gait and Station - The patient's transfers, posture, gait, station, and turns were observed as normal.   Functional score  mRS = 0   0 - No symptoms.   1 - No significant disability. Able to carry out all usual activities, despite some symptoms.   2 - Slight disability. Able to look after own affairs without assistance, but unable to carry out all previous activities.   3 - Moderate disability. Requires some help, but able to walk unassisted.   4 - Moderately severe disability. Unable to attend to own bodily needs without assistance, and unable to walk unassisted.   5 - Severe disability. Requires constant nursing care and attention, bedridden, incontinent.   6 - Dead.   NIH Stroke Scale = 0   Data reviewed: I personally reviewed the images and agree with the radiology interpretations.  Ct Head Wo Contrast 04/09/2015 1. No acute intracranial pathology. 2. Right frontal scalp hematoma.   Ct Angio Head & Neck W/cm &/or Wo/cm 04/10/2015 No extracranial stenosis or intracranial stenosis of significance. No hemorrhage, vascular occlusion, or abnormal enhancement associated with the small LEFT posterior temporal CVA.   Mr Brain Wo Contrast 04/10/2015 Acute LEFT posterior temporal lobe hippocampal infarct could correlate with patient's history of vertigo and syncopal episode. RIGHT frontal scalp hematoma without signs of extra-axial hematoma, shearing injury, or subarachnoid blood. Normal for age cerebral volume with moderately advanced chronic microvascular ischemic change.   Portable Chest 1 View 04/09/2015 No active disease.   2D Echocardiogram  - Left ventricle: The cavity size was normal. There was mild concentric hypertrophy. Systolic function was normal. The estimated ejection fraction was in the range of 60% to 65%. Wall motion was normal; there were no regional wall motio abnormalities. Doppler parameters are consistent with abnormal left ventricular  relaxation (grade 1 diastolic dysfunction). - Aortic valve: Transvalvular velocity was within the normal range. There was no stenosis. There was no regurgitation. - Mitral valve: Transvalvular velocity was within the normal range. There was no evidence for stenosis. There was no regurgitation. - Right ventricle: The cavity size was normal. Wall thickness was normal. Systolic function was normal. - Atrial septum: No defect or patent foramen ovale was identified by color flow Doppler. - Tricuspid valve: There was mild regurgitation. - Pulmonary arteries: Systolic pressure was within the normal range. PA peak pressure: 26 mm Hg (S). - Inferior vena cava: The vessel was normal in size. The respirophasic diameter changes were in the normal range (>= 50%), consistent with normal central venous pressure.  EEG Unremarkable awake and drowsy routine inpatient EEG.   Component     Latest Ref Rng 04/09/2015 04/11/2015  Cholesterol     0 - 200 mg/dL  811  Triglycerides     <150 mg/dL  69  HDL Cholesterol     >40 mg/dL  59  Total CHOL/HDL Ratio       2.5  VLDL     0 - 40 mg/dL  14  LDL (calc)     0 - 99 mg/dL  76  Hemoglobin B1Y     4.8 - 5.6 %  5.5  Mean Plasma Glucose       111  TSH  0.350 - 4.500 uIU/mL 1.530   Vitamin B12     180 - 914 pg/mL  275  Folate     >5.9 ng/mL  12.4    Assessment: As you may recall, she is a 75 y.o. Caucasian female with PMH of hypertension and vertigo admitted on 04/09/15 for syncope with LOC and retrograde amnesia. Her syncope most likely vasovagal and BP low on admission. TTE unremarkable and tele no arrhythmia. 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 Although hippocampal infarct has been showing to be related to memory loss, it is usually a larger lesion or bilateral lesions and usually with more profound memory disturbance. Infarct most likely secondary to small vessel disease source. CTA  head and neck unremarkable. EEG unremarkable and LDL 76 and A1C 5.5. He was put on plavix and crestor  on discharge. During interval time, she is doing well. She switched from plavix to baby ASA by herself and still has right hand brace and to be expected off brace in 3 weeks. Otherwise, no complains.   Plan:  - continue ASA and crestor for stroke prevention - check BP at home and record and bring over to Dr. Clelia Croft for medication adjustment if needed. - keep hydrated - Follow up with your primary care physician for stroke risk factor modification. Recommend maintain blood pressure goal 120-140/70-90 and lipids with LDL cholesterol goal below 70 mg/dL.  - healthy diet and regular exercise - follow up as needed.   No orders of the defined types were placed in this encounter.    Meds ordered this encounter  Medications  . aspirin 81 MG tablet    Sig: Take 81 mg by mouth daily.  Marland Kitchen loratadine (CLARITIN) 10 MG tablet    Sig: Take 10 mg by mouth daily.    Patient Instructions  - continue ASA and crestor for stroke prevention - check BP at home and record and bring over to Dr. Clelia Croft for medication adjustment if needed. - keep hydrated - Follow up with your primary care physician for stroke risk factor modification. Recommend maintain blood pressure goal 120-140/70-90 and lipids with LDL cholesterol goal below 70 mg/dL.  - healthy diet and regular exercise - follow up as needed.     Marvel Plan, MD PhD [facility] [address] [place], [place] [address] [phone]

## 2015-05-16 NOTE — Patient Instructions (Addendum)
-   continue ASA and crestor for stroke prevention - check BP at home and record and bring over to Dr. Clelia CroftShaw for medication adjustment if needed. - keep hydrated - Follow up with your primary care physician for stroke risk factor modification. Recommend maintain blood pressure goal 120-140/70-90 and lipids with LDL cholesterol goal below 70 mg/dL.  - healthy diet and regular exercise - follow up as needed.

## 2015-06-26 ENCOUNTER — Ambulatory Visit: Payer: Medicare Other | Admitting: Nurse Practitioner

## 2015-08-30 NOTE — Progress Notes (Deleted)
75 y.o. 831P1001 Married Caucasian female here for annual exam.    PCP:  W. Buren Kosouglas Shaw, MD   Patient's last menstrual period was 01/13/2002.           Sexually active: {yes no:314532}  The current method of family planning is post menopausal status.    Exercising: {yes no:314532}  {types:19826} Smoker:  no  Health Maintenance: Pap:  08-30-14 Neg History of abnormal Pap:  no MMG:  08-11-13 Density B/Neg/BiRads1:[facility] Colonoscopy: ??due 2017?? 2012 [facility];next due  BMD:   ***  Result  *** TDaP:  PCP Gardasil:   N/A HIV: Hep C: Screening Labs:  Hb today: ***, Urine today: ***   reports that she quit smoking about 42 years ago. Her smoking use included Cigarettes. She has a 2.50 pack-year smoking history. She has never used smokeless tobacco. She reports that she drinks about 2.4 oz of alcohol per week . She reports that she does not use drugs.  Past Medical History:  Diagnosis Date  . Acute ear infection 04/09/2015  . Anxiety   . Chronic bronchitis (HCC)    "get it ~ q other year" (04/09/2015)  . Depression   . Fibromyalgia   . Fx metacarpal    03/2015  . High cholesterol   . Hypertension   . Osteopenia   . Pneumonia ~ 2014  . Sinus headache   . Syncope    03/2015  . Urinary incontinence   . Vertigo     Past Surgical History:  Procedure Laterality Date  . BUNIONECTOMY WITH HAMMERTOE RECONSTRUCTION Right   . SHOULDER OPEN ROTATOR CUFF REPAIR Right   . TONSILLECTOMY      Current Outpatient Prescriptions  Medication Sig Dispense Refill  . aspirin 81 MG tablet Take 81 mg by mouth daily.    . cholecalciferol (VITAMIN D) 1000 UNITS tablet Take 1,000 Units by mouth daily.    Marland Kitchen. loratadine (CLARITIN) 10 MG tablet Take 10 mg by mouth daily.    Marland Kitchen. losartan (COZAAR) 25 MG tablet Take 12.5 mg by mouth daily.     . Multiple Vitamin (MULTIVITAMIN) tablet Take 1 tablet by mouth daily.    Marland Kitchen. Propylene Glycol (SYSTANE BALANCE) 0.6 % SOLN Place 1 drop into  both eyes daily.     . rosuvastatin (CRESTOR) 10 MG tablet Take 5 mg by mouth daily.     . sertraline (ZOLOFT) 100 MG tablet Take 100 mg by mouth daily.     No current facility-administered medications for this visit.     Family History  Problem Relation Age of Onset  . Heart disease Mother   . Heart failure Mother   . Stroke Mother   . Heart disease Father   . Heart failure Father   . Heart disease Brother   . Heart failure Brother   . Heart failure Brother     ROS:  Pertinent items are noted in HPI.  Otherwise, a comprehensive ROS was negative.  Exam:   LMP 01/13/2002     General appearance: alert, cooperative and appears stated age Head: Normocephalic, without obvious abnormality, atraumatic Neck: no adenopathy, supple, symmetrical, trachea midline and thyroid normal to inspection and palpation Lungs: clear to auscultation bilaterally Breasts: normal appearance, no masses or tenderness, No nipple retraction or dimpling, No nipple discharge or bleeding, No axillary or supraclavicular adenopathy Heart: regular rate and rhythm Abdomen: soft, non-tender; no masses, no organomegaly Extremities: extremities normal, atraumatic, no cyanosis or edema Skin: Skin color,  texture, turgor normal. No rashes or lesions Lymph nodes: Cervical, supraclavicular, and axillary nodes normal. No abnormal inguinal nodes palpated Neurologic: Grossly normal  Pelvic: External genitalia:  no lesions              Urethra:  normal appearing urethra with no masses, tenderness or lesions              Bartholins and Skenes: normal                 Vagina: normal appearing vagina with normal color and discharge, no lesions              Cervix: no lesions              Pap taken: {yes no:314532} Bimanual Exam:  Uterus:  normal size, contour, position, consistency, mobility, non-tender              Adnexa: no mass, fullness, tenderness              Rectal exam: {yes no:314532}.  Confirms.              Anus:   normal sphincter tone, no lesions  Chaperone was present for exam.  Assessment:   Well woman visit with normal exam.   Plan: Yearly mammogram recommended after age 8940.  Recommended self breast exam.  Pap and HR HPV as above. Discussed Calcium, Vitamin D, regular exercise program including cardiovascular and weight bearing exercise.   Follow up annually and prn.   Additional counseling given.  {yes [id number]:314532}. _______ minutes face to face time of which over 50% was spent in counseling.    After visit summary provided.

## 2015-08-31 ENCOUNTER — Ambulatory Visit: Payer: Medicare Other | Admitting: Obstetrics and Gynecology

## 2015-09-04 ENCOUNTER — Encounter: Payer: Self-pay | Admitting: Obstetrics and Gynecology

## 2015-09-04 ENCOUNTER — Ambulatory Visit (INDEPENDENT_AMBULATORY_CARE_PROVIDER_SITE_OTHER): Payer: Medicare Other | Admitting: Obstetrics and Gynecology

## 2015-09-04 VITALS — BP 128/80 | HR 80 | Resp 16 | Ht 59.0 in | Wt 129.0 lb

## 2015-09-04 DIAGNOSIS — Z01419 Encounter for gynecological examination (general) (routine) without abnormal findings: Secondary | ICD-10-CM

## 2015-09-04 NOTE — Patient Instructions (Signed)

## 2015-09-04 NOTE — Progress Notes (Signed)
75 y.o. [id number] Married Caucasian female here for annual exam.    Frequent urination. No change in pattern.  Up once a night to void.  No painful urination.   Patient had a TIA this spring and a concussion from falling after losing consciousness.  Husband has been ill.  Has dementia.  Stressful.  Now will be building a cottage.   Trying to exercise and walk.    PCP:   Martha ClanWilliam Shaw, MD  Patient's last menstrual period was 01/13/2002.           Sexually active: No.  The current method of family planning is post menopausal status.    Exercising: Yes.    walk 3-4 x weekly, gym Smoker:  no  Health Maintenance: Pap:  08/30/14 Neg  History of abnormal Pap:  no MMG:  08/11/13 BIRADS1:neg.  [facility] will [facility] at [facility].   Colonoscopy:  2012 Polyps - f/u 5 years.  She will call Dr. Gelene MinkGannin to schedule.  BMD:   07/06/13  Result  Normal  TDaP:  Current with PCP HIV: No Hep C: No Screening Labs: PCP   reports that she quit smoking about 42 years ago. Her smoking use included Cigarettes. She has a 2.50 pack-year smoking history. She has never used smokeless tobacco. She reports that she drinks about 2.4 oz of alcohol per week . She reports that she does not use drugs.  Past Medical History:  Diagnosis Date  . Acute ear infection 04/09/2015  . Anxiety   . Chronic bronchitis (HCC)    "get it ~ q other year" (04/09/2015)  . Closed right hand fracture 03/2015   due to fall  . Depression   . Fall 04/09/2015   at doctors office  . Fibromyalgia   . Fx metacarpal    03/2015  . High cholesterol   . Hypertension   . Osteopenia   . Pneumonia ~ 2014  . Sinus headache   . Syncope    03/2015  . Urinary incontinence   . Vertigo     Past Surgical History:  Procedure Laterality Date  . BUNIONECTOMY WITH HAMMERTOE RECONSTRUCTION Right   . SHOULDER OPEN ROTATOR CUFF REPAIR Right   . TONSILLECTOMY      Current Outpatient Prescriptions  Medication Sig Dispense Refill  . aspirin 81 MG  tablet Take 81 mg by mouth daily.    . cholecalciferol (VITAMIN D) 1000 UNITS tablet Take 1,000 Units by mouth daily.    Marland Kitchen. loratadine (CLARITIN) 10 MG tablet Take 10 mg by mouth daily.    . Multiple Vitamin (MULTIVITAMIN) tablet Take 1 tablet by mouth daily.    Marland Kitchen. Propylene Glycol (SYSTANE BALANCE) 0.6 % SOLN Place 1 drop into both eyes daily.     . rosuvastatin (CRESTOR) 10 MG tablet Take 5 mg by mouth daily.     . sertraline (ZOLOFT) 100 MG tablet Take 100 mg by mouth daily.     No current facility-administered medications for this visit.     Family History  Problem Relation Age of Onset  . Heart disease Mother   . Heart failure Mother   . Stroke Mother   . Heart disease Father   . Heart failure Father   . Heart disease Brother   . Heart failure Brother   . Heart failure Brother     ROS:  Pertinent items are noted in HPI.  Otherwise, a comprehensive ROS was negative.  Exam:   BP 128/80 (BP Location: Right Arm, Patient Position:  Sitting, Cuff Size: Normal)   Pulse 80   Resp 16   Ht 4\' 11"  (1.499 m)   Wt 129 lb (58.5 kg)   LMP 01/13/2002   BMI 26.05 kg/m     General appearance: alert, cooperative and appears stated age Head: Normocephalic, without obvious abnormality, atraumatic Neck: no adenopathy, supple, symmetrical, trachea midline and thyroid normal to inspection and palpation Lungs: clear to auscultation bilaterally Breasts: normal appearance, no masses or tenderness, No nipple retraction or dimpling, No nipple discharge or bleeding, No axillary or supraclavicular adenopathy Heart: regular rate and rhythm Abdomen: soft, non-tender; no masses, no organomegaly Extremities: extremities normal, atraumatic, no cyanosis or edema Skin: Skin color, texture, turgor normal. No rashes or lesions Lymph nodes: Cervical, supraclavicular, and axillary nodes normal. No abnormal inguinal nodes palpated Neurologic: Grossly normal  Pelvic: External genitalia:  no lesions               Urethra:  normal appearing urethra with no masses, tenderness or lesions              Bartholins and Skenes: normal                 Vagina: normal appearing vagina with normal color and discharge, no lesions              Cervix: no lesions              Pap taken: No. Bimanual Exam:  Uterus:  normal size, contour, position, consistency, mobility, non-tender              Adnexa: no mass, fullness, tenderness              Rectal exam: Yes.  .  Confirms.              Anus:  normal sphincter tone, no lesions  Chaperone was present for exam.  Assessment:   Well woman visit with normal exam.  Plan: Yearly mammogram recommended after age 75.  Patient will call [facility] to schedule. Recommended self breast exam.  Pap and HR HPV as above. Discussed Calcium, Vitamin D, regular exercise program including cardiovascular and weight bearing exercise. Patient will call her GI office to schedule colonoscopy.  Follow up annually and prn.       After visit summary provided.

## 2015-09-12 ENCOUNTER — Ambulatory Visit: Payer: Medicare Other | Admitting: Obstetrics and Gynecology

## 2015-11-01 ENCOUNTER — Encounter: Payer: Self-pay | Admitting: Obstetrics and Gynecology

## 2015-12-24 ENCOUNTER — Emergency Department (HOSPITAL_COMMUNITY): Payer: Medicare Other

## 2015-12-24 ENCOUNTER — Encounter (HOSPITAL_COMMUNITY): Payer: Self-pay | Admitting: *Deleted

## 2015-12-24 ENCOUNTER — Emergency Department (HOSPITAL_COMMUNITY)
Admission: EM | Admit: 2015-12-24 | Discharge: 2015-12-24 | Disposition: A | Discharge status: Home / Self Care | Payer: Medicare Other | Attending: Emergency Medicine | Admitting: Emergency Medicine

## 2015-12-24 DIAGNOSIS — S42211A Unspecified displaced fracture of surgical neck of right humerus, initial encounter for closed fracture: Secondary | ICD-10-CM | POA: Insufficient documentation

## 2015-12-24 DIAGNOSIS — S4991XA Unspecified injury of right shoulder and upper arm, initial encounter: Secondary | ICD-10-CM | POA: Diagnosis present

## 2015-12-24 DIAGNOSIS — Y929 Unspecified place or not applicable: Secondary | ICD-10-CM | POA: Insufficient documentation

## 2015-12-24 DIAGNOSIS — W000XXA Fall on same level due to ice and snow, initial encounter: Secondary | ICD-10-CM | POA: Insufficient documentation

## 2015-12-24 DIAGNOSIS — Z8673 Personal history of transient ischemic attack (TIA), and cerebral infarction without residual deficits: Secondary | ICD-10-CM | POA: Diagnosis not present

## 2015-12-24 DIAGNOSIS — Z7982 Long term (current) use of aspirin: Secondary | ICD-10-CM | POA: Diagnosis not present

## 2015-12-24 DIAGNOSIS — Z87891 Personal history of nicotine dependence: Secondary | ICD-10-CM | POA: Insufficient documentation

## 2015-12-24 DIAGNOSIS — I10 Essential (primary) hypertension: Secondary | ICD-10-CM | POA: Diagnosis not present

## 2015-12-24 DIAGNOSIS — Y939 Activity, unspecified: Secondary | ICD-10-CM | POA: Diagnosis not present

## 2015-12-24 DIAGNOSIS — Y999 Unspecified external cause status: Secondary | ICD-10-CM | POA: Diagnosis not present

## 2015-12-24 DIAGNOSIS — S42201A Unspecified fracture of upper end of right humerus, initial encounter for closed fracture: Secondary | ICD-10-CM

## 2015-12-24 MED ORDER — ONDANSETRON HCL 4 MG/2ML IJ SOLN
4.0000 mg | Freq: Once | INTRAMUSCULAR | Status: AC
  Administered 2015-12-24: 4 mg via INTRAVENOUS
  Filled 2015-12-24: qty 2

## 2015-12-24 MED ORDER — FENTANYL CITRATE (PF) 100 MCG/2ML IJ SOLN: 50.0000 ug | Freq: Once | INTRAMUSCULAR | Status: DC

## 2015-12-24 MED ORDER — FENTANYL CITRATE (PF) 100 MCG/2ML IJ SOLN
50.0000 ug | Freq: Once | INTRAMUSCULAR | Status: AC
  Administered 2015-12-24: 100 ug via INTRAVENOUS

## 2015-12-24 MED ORDER — MORPHINE SULFATE (PF) 4 MG/ML IV SOLN
4.0000 mg | Freq: Once | INTRAVENOUS | Status: AC
  Administered 2015-12-24: 4 mg via INTRAVENOUS
  Filled 2015-12-24: qty 1

## 2015-12-24 MED ORDER — FENTANYL CITRATE (PF) 100 MCG/2ML IJ SOLN
INTRAMUSCULAR | Status: AC
  Administered 2015-12-24: 100 ug via INTRAVENOUS
  Filled 2015-12-24: qty 2

## 2015-12-24 MED ORDER — OXYCODONE-ACETAMINOPHEN 5-325 MG PO TABS: 1.0000 | ORAL_TABLET | Freq: Four times a day (QID) | ORAL | 0 refills | Status: DC | PRN

## 2015-12-24 NOTE — ED Notes (Signed)
ED Provider at bedside. 

## 2015-12-24 NOTE — ED Notes (Signed)
Patient Alert and oriented X4. Stable and ambulatory. Patient verbalized understanding of the discharge instructions.  Patient belongings were taken by the patient.  

## 2015-12-24 NOTE — ED Triage Notes (Signed)
Pt has had rotator cuff surgery in the past.  About 0930 she slipped on ice and thinks she landed on right elbow.  Pt is having to lift arm and move it herself.  Deformity to right shoulder, palpable pulse.

## 2015-12-24 NOTE — Progress Notes (Signed)
Orthopedic Tech Progress Note Patient Details:  Vickii PennaJoyce H Lawry 06/16/1940 [id number]  Ortho Devices Type of Ortho Device: Sling immobilizer Ortho Device/Splint Interventions: Application   Saul FordyceJennifer C Scarlette Hogston 12/24/2015, 4:24 PM

## 2015-12-24 NOTE — ED Provider Notes (Signed)
[facility] Provider Note   CSN: [id number] Arrival date & time: 12/24/15  1055     History   Chief Complaint Chief Complaint  Patient presents with  . Arm Injury    HPI Vickii PennaJoyce H Meanor is a 75 y.o. female.  Patient is a 75 year old female with history of chronic bronchitis, hypertension, fibromyalgia. She presents for evaluation of right shoulder pain. She reports slipping on the ice and landing on her right elbow. She has been unable to move her shoulder since. She has a history of rotator cuff repair and is concerned she may have compromised this.   The history is provided by the patient.  Arm Injury   This is a new problem. The current episode started less than 1 hour ago. The problem occurs constantly. The problem has not changed since onset.Pain location: Right shoulder. The pain is severe. Associated symptoms include limited range of motion and stiffness. Pertinent negatives include no tingling. The symptoms are aggravated by contact. She has tried nothing for the symptoms.    Past Medical History:  Diagnosis Date  . Acute ear infection 04/09/2015  . Anxiety   . Chronic bronchitis (HCC)    "get it ~ q other year" (04/09/2015)  . Closed right hand fracture 03/2015   due to fall  . Depression   . Fall 04/09/2015   at doctors office  . Fibromyalgia   . Fx metacarpal    03/2015  . High cholesterol   . Hypertension   . Osteopenia   . Pneumonia ~ 2014  . Sinus headache   . Syncope    03/2015  . Urinary incontinence   . Vertigo     Patient Active Problem List   Diagnosis Date Noted  . 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 HLD (hyperlipidemia)   . Acute CVA (cerebrovascular accident) (HCC) 04/10/2015  . Closed fracture of 5th metacarpal   . Essential hypertension   . Fall   . Syncope and collapse   . Syncope 04/09/2015  . Fx metacarpal 04/09/2015  . Scalp hematoma 04/09/2015  .  Depression   . Hypertension   . Vertigo     Past Surgical History:  Procedure Laterality Date  . BUNIONECTOMY WITH HAMMERTOE RECONSTRUCTION Right   . SHOULDER OPEN ROTATOR CUFF REPAIR Right   . TONSILLECTOMY      OB History    Gravida Para Term Preterm AB Living   1 1 1  0 0 1   SAB TAB Ectopic Multiple Live Births   0 0 0 0 1       Home Medications    Prior to Admission medications   Medication Sig Start Date End Date Taking? Authorizing Provider  aspirin 81 MG tablet Take 81 mg by mouth daily.    Historical Provider, MD  cholecalciferol (VITAMIN D) 1000 UNITS tablet Take 1,000 Units by mouth daily.    Historical Provider, MD  loratadine (CLARITIN) 10 MG tablet Take 10 mg by mouth daily.    Historical Provider, MD  Multiple Vitamin (MULTIVITAMIN) tablet Take 1 tablet by mouth daily.    Historical Provider, MD  Propylene Glycol (SYSTANE BALANCE) 0.6 % SOLN Place 1 drop into both eyes daily.     Historical Provider, MD  rosuvastatin (CRESTOR) 10 MG tablet Take 5 mg by mouth daily.     Historical Provider, MD  sertraline (ZOLOFT) 100 MG tablet Take 100 mg by mouth daily.    Historical  Provider, MD    Family History Family History  Problem Relation Age of Onset  . Heart disease Mother   . Heart failure Mother   . Stroke Mother   . Heart disease Father   . Heart failure Father   . Heart disease Brother   . Heart failure Brother   . Heart failure Brother     Social History Social History  Substance Use Topics  . Smoking status: Former Smoker    Packs/day: 0.25    Years: 10.00    Types: Cigarettes    Quit date: 01/13/1973  . Smokeless tobacco: Never Used  . Alcohol use 2.4 oz/week    2 Glasses of wine, 2 Standard drinks or equivalent per week     Allergies   Demeclocycline; Penicillins; Tetracyclines & related; and Codeine   Review of Systems Review of Systems  Musculoskeletal: Positive for stiffness.  Neurological: Negative for tingling.  All other systems  reviewed and are negative.    Physical Exam Updated Vital Signs BP 117/71 (BP Location: Right Arm)   Pulse 83   Temp 97.8 F (36.6 C) (Oral)   Resp 22   LMP 01/13/2002   SpO2 98%   Physical Exam  Constitutional: She appears well-developed and well-nourished. No distress.  HENT:  Head: Normocephalic and atraumatic.  Neck: Normal range of motion. Neck supple.  Pulmonary/Chest: Effort normal.  Musculoskeletal:  There is swelling noted over the lateral aspect of the deltoid. There is pain with any range of motion. Ulnar and radial pulses are easily palpable. She is able to flex, extend, and oppose all fingers and sensation is intact throughout the entire hand. There is no tenderness or deformity over the elbow.  Skin: Skin is warm and dry. She is not diaphoretic.  Nursing note and vitals reviewed.    ED Treatments / Results  Labs (all labs ordered are listed, but only abnormal results are displayed) Labs Reviewed - No data to display  EKG  EKG Interpretation None       Radiology No results found.  Procedures Procedures (including critical care time)  Medications Ordered in ED Medications  fentaNYL (SUBLIMAZE) injection 50 mcg (100 mcg Intravenous Given 12/24/15 1233)     Initial Impression / Assessment and Plan / ED Course  I have reviewed the triage vital signs and the nursing notes.  Pertinent labs & imaging results that were available during my care of the patient were reviewed by me and considered in my medical decision making (see chart for details).  Clinical Course     The patient's x-rays were reviewed with Dr. Eulah PontMurphy who is recommending a CT of the shoulder. Will be obtained and the patient will be discharged with outpatient follow-up.  Final Clinical Impressions(s) / ED Diagnoses   Final diagnoses:  None    New Prescriptions New Prescriptions   No medications on file     Geoffery Lyonsouglas Majorie Santee, MD 12/24/15 1558

## 2015-12-24 NOTE — Discharge Instructions (Signed)
Percocet as prescribed.  Wear your immobilizer until followed up by orthopedics.  Ice for 20 minutes every 2 hours while awake for the next 2 days.  Follow-up with Dr. Eulah PontMurphy later this week. The contact information for his office has been provided for you to call and make these arrangements.

## 2016-05-27 ENCOUNTER — Other Ambulatory Visit: Payer: Self-pay | Admitting: Internal Medicine

## 2016-05-27 ENCOUNTER — Other Ambulatory Visit: Payer: Medicare Other

## 2016-05-27 DIAGNOSIS — M549 Dorsalgia, unspecified: Secondary | ICD-10-CM

## 2016-05-29 ENCOUNTER — Ambulatory Visit
Admission: RE | Admit: 2016-05-29 | Discharge: 2016-05-29 | Disposition: A | Discharge status: Home / Self Care | Payer: Medicare Other | Source: Ambulatory Visit | Attending: Internal Medicine | Admitting: Internal Medicine

## 2016-05-29 DIAGNOSIS — M549 Dorsalgia, unspecified: Secondary | ICD-10-CM

## 2017-01-18 ENCOUNTER — Encounter (HOSPITAL_COMMUNITY): Payer: Self-pay | Admitting: *Deleted

## 2017-01-18 ENCOUNTER — Other Ambulatory Visit: Payer: Self-pay

## 2017-01-18 ENCOUNTER — Ambulatory Visit (HOSPITAL_COMMUNITY)
Admission: EM | Admit: 2017-01-18 | Discharge: 2017-01-18 | Disposition: A | Discharge status: Home / Self Care | Payer: Medicare Other | Attending: Physician Assistant | Admitting: Physician Assistant

## 2017-01-18 DIAGNOSIS — J019 Acute sinusitis, unspecified: Secondary | ICD-10-CM | POA: Diagnosis not present

## 2017-01-18 DIAGNOSIS — B9689 Other specified bacterial agents as the cause of diseases classified elsewhere: Secondary | ICD-10-CM

## 2017-01-18 MED ORDER — AZITHROMYCIN 250 MG PO TABS: ORAL_TABLET | ORAL | 0 refills | Status: AC

## 2017-01-18 NOTE — Discharge Instructions (Signed)
Start the antibiotics as quickly as possible.

## 2017-01-18 NOTE — ED Provider Notes (Addendum)
01/18/2017 5:52 PM   DOB: 04/22/1940 / MRN: [id number]  SUBJECTIVE:  Carolyn Hart is a 77 y.o. female presenting for right sided maxillary tenderness.  She tells me this started about 3 days ago but had other resolved URI like symptoms that started roughly 10 days ago.  She feels that she is getting worse.    She is allergic to demeclocycline; penicillins; tetracyclines & related; codeine; and levaquin [levofloxacin].   She  has a past medical history of Acute ear infection (04/09/2015), Anxiety, Chronic bronchitis (HCC), Closed right hand fracture (03/2015), Depression, Fall (04/09/2015), Fibromyalgia, Fx metacarpal, High cholesterol, Hypertension, Osteopenia, Pneumonia (~ 2014), Sinus headache, Syncope, Urinary incontinence, and Vertigo.    She  reports that she quit smoking about 44 years ago. Her smoking use included cigarettes. She has a 2.50 pack-year smoking history. she has never used smokeless tobacco. She reports that she drinks about 2.4 oz of alcohol per week. She reports that she does not use drugs. She  has no sexual activity history on file. The patient  has a past surgical history that includes Shoulder open rotator cuff repair (Right); Tonsillectomy; and Bunionectomy with hammertoe reconstruction (Right).  Her family history includes Heart disease in her brother, father, and mother; Heart failure in her brother, brother, father, and mother; Stroke in her mother.  Review of Systems  Constitutional: Negative for chills, diaphoresis and fever.  Respiratory: Negative for cough, hemoptysis, sputum production, shortness of breath and wheezing.   Cardiovascular: Negative for chest pain, orthopnea and leg swelling.  Gastrointestinal: Negative for nausea.  Skin: Negative for rash.  Neurological: Negative for dizziness.    OBJECTIVE:  BP (!) 142/75   Pulse 75   Temp 98.3 F (36.8 C) (Oral)   Resp 18   LMP 01/13/2002   SpO2 98%   Physical Exam  Constitutional: She is active.   Non-toxic appearance.  HENT:  Right Ear: Hearing, tympanic membrane, external ear and ear canal normal.  Left Ear: Hearing, tympanic membrane, external ear and ear canal normal.  Nose: Right sinus exhibits frontal sinus tenderness. Right sinus exhibits no maxillary sinus tenderness. Left sinus exhibits no maxillary sinus tenderness and no frontal sinus tenderness.  Mouth/Throat: Uvula is midline, oropharynx is clear and moist and mucous membranes are normal. Mucous membranes are not dry. No oropharyngeal exudate, posterior oropharyngeal edema or tonsillar abscesses.  Cardiovascular: Normal rate.  Pulmonary/Chest: Effort normal. No tachypnea.  Lymphadenopathy:       Head (right side): No submandibular and no tonsillar adenopathy present.       Head (left side): No submandibular and no tonsillar adenopathy present.    She has no cervical adenopathy.  Neurological: She is alert.  Skin: Skin is warm and dry. She is not diaphoretic. No pallor.    No results found for this or any previous visit (from the past 72 hour(s)).  No results found.  ASSESSMENT AND PLAN:  Acute bacterial rhinosinusitis - Given chronicity of symtopms and worsening will go ahead and start azithro.  She is allergic to most other medications that would treat this type of infection.      The patient is advised to call or return to clinic if she does not see an improvement in symptoms, or to seek the care of the closest emergency department if she worsens with the above plan.   Deliah BostonMichael Tacarra Justo, MHS, PA-C 01/18/2017 5:52 PM   Ofilia Neaslark, Nichols Corter L, PA-C 01/18/17 1752    Ofilia Neaslark, Xareni Kelch L, PA-C 01/18/17 1753

## 2017-01-18 NOTE — ED Triage Notes (Signed)
C/O sinus congestion and facial pain x approx 1 month with worsening over past week.  Yesterday had fever, facial swelling, bilat ear pain, dizziness.

## 2017-06-08 ENCOUNTER — Ambulatory Visit (HOSPITAL_COMMUNITY)
Admission: EM | Admit: 2017-06-08 | Discharge: 2017-06-08 | Disposition: A | Discharge status: Home / Self Care | Payer: Medicare Other | Attending: Family Medicine | Admitting: Family Medicine

## 2017-06-08 ENCOUNTER — Encounter (HOSPITAL_COMMUNITY): Payer: Self-pay | Admitting: Emergency Medicine

## 2017-06-08 ENCOUNTER — Other Ambulatory Visit: Payer: Self-pay

## 2017-06-08 DIAGNOSIS — H6522 Chronic serous otitis media, left ear: Secondary | ICD-10-CM | POA: Diagnosis not present

## 2017-06-08 MED ORDER — SULFAMETHOXAZOLE-TRIMETHOPRIM 800-160 MG PO TABS: 1.0000 | ORAL_TABLET | Freq: Two times a day (BID) | ORAL | 0 refills | Status: AC

## 2017-06-08 NOTE — ED Triage Notes (Signed)
Onset Friday night with nausea, vomiting.   No vomiting since then, but still feels nauseated.  Ears feel different and forehead fullness

## 2017-06-08 NOTE — ED Provider Notes (Signed)
[facility]    CSN: [id number] Arrival date & time: 06/08/17  1714     History   Chief Complaint Chief Complaint  Patient presents with  . URI    HPI Carolyn Hart is a 77 y.o. female.   Patient has had some sinus pressure as well as pressure in her ears some nausea and vomiting and dizziness.  Has a history of vertigo but this has not really manifested itself as vertigo or lightheadedness.  Sputum is mildly discolored.  HPI  Past Medical History:  Diagnosis Date  . Acute ear infection 04/09/2015  . Anxiety   . Chronic bronchitis (HCC)    "get it ~ q other year" (04/09/2015)  . Closed right hand fracture 03/2015   due to fall  . Depression   . Fall 04/09/2015   at doctors office  . Fibromyalgia   . Fx metacarpal    03/2015  . High cholesterol   . Hypertension   . Osteopenia   . Pneumonia ~ 2014  . Sinus headache   . Syncope    03/2015  . Urinary incontinence   . Vertigo     Patient Active Problem List   Diagnosis Date Noted  . Left chronic serous otitis media 06/08/2017  . 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 HLD (hyperlipidemia)   . Acute CVA (cerebrovascular accident) (HCC) 04/10/2015  . Closed fracture of 5th metacarpal   . Essential hypertension   . Fall   . Syncope and collapse   . Syncope 04/09/2015  . Fx metacarpal 04/09/2015  . Scalp hematoma 04/09/2015  . Depression   . Hypertension   . Vertigo     Past Surgical History:  Procedure Laterality Date  . BUNIONECTOMY WITH HAMMERTOE RECONSTRUCTION Right   . SHOULDER OPEN ROTATOR CUFF REPAIR Right   . TONSILLECTOMY      OB History    Gravida  1   Para  1   Term  1   Preterm  0   AB  0   Living  1     SAB  0   TAB  0   Ectopic  0   Multiple  0   Live Births  1            Home Medications    Prior to Admission medications   Medication Sig Start Date End Date Taking? Authorizing Provider    aspirin 81 MG tablet Take 81 mg by mouth daily.    [provider]  cholecalciferol (VITAMIN D) 1000 UNITS tablet Take 1,000 Units by mouth daily.    [provider]  ezetimibe (ZETIA) 10 MG tablet Take 10 mg by mouth daily.    [provider]  loratadine (CLARITIN) 10 MG tablet Take 10 mg by mouth daily.    [provider]  Multiple Vitamin (MULTIVITAMIN) tablet Take 1 tablet by mouth daily.    [provider]  Propylene Glycol (SYSTANE BALANCE) 0.6 % SOLN Place 1 drop into both eyes daily.     [provider]  rosuvastatin (CRESTOR) 10 MG tablet Take 10 mg by mouth daily.     [provider]  sertraline (ZOLOFT) 100 MG tablet Take 100 mg by mouth daily.    [provider]  sulfamethoxazole-trimethoprim (BACTRIM DS,SEPTRA DS) 800-160 MG tablet Take 1 tablet by mouth 2 (two) times daily for 7 days. 06/08/17 06/15/17  Frederica Kuster, MD  Family History Family History  Problem Relation Age of Onset  . Heart disease Mother   . Heart failure Mother   . Stroke Mother   . Heart disease Father   . Heart failure Father   . Heart disease Brother   . Heart failure Brother   . Heart failure Brother     Social History Social History   Tobacco Use  . Smoking status: Former Smoker    Packs/day: 0.25    Years: 10.00    Pack years: 2.50    Types: Cigarettes    Last attempt to quit: 01/13/1973    Years since quitting: 44.4  . Smokeless tobacco: Never Used  Substance Use Topics  . Alcohol use: Yes    Alcohol/week: 2.4 oz    Types: 2 Glasses of wine, 2 Standard drinks or equivalent per week  . Drug use: No     Allergies   Demeclocycline; Penicillins; Tetracyclines & related; Codeine; and Levaquin [levofloxacin]   Review of Systems Review of Systems  Constitutional: Negative.   HENT: Positive for congestion and sinus pressure.   Eyes: Negative.   Respiratory: Negative.      Physical Exam Triage Vital  Signs ED Triage Vitals  Enc Vitals Group     BP 06/08/17 1836 133/70     Pulse Rate 06/08/17 1836 77     Resp 06/08/17 1836 (!) 1     Temp 06/08/17 1836 97.9 F (36.6 C)     Temp Source 06/08/17 1836 Oral     SpO2 06/08/17 1836 100 %     Weight --      Height --      Head Circumference --      Peak Flow --      Pain Score 06/08/17 1833 7     Pain Loc --      Pain Edu? --      Excl. in GC? --    No data found.  Updated Vital Signs BP 133/70 (BP Location: Left Arm)   Pulse 77   Temp 97.9 F (36.6 C) (Oral)   Resp (!) 1   LMP 01/13/2002   SpO2 100%   Visual Acuity Right Eye Distance:   Left Eye Distance:   Bilateral Distance:    Right Eye Near:   Left Eye Near:    Bilateral Near:     Physical Exam  Constitutional: She is oriented to person, place, and time. She appears well-developed and well-nourished.  HENT:  Head: Normocephalic.  Mouth/Throat: Oropharynx is clear and moist.  Left TM is dull without normal light reflex compared to right TM  Eyes: Pupils are equal, round, and reactive to light.  Cardiovascular: Normal rate.  Pulmonary/Chest: Effort normal.  Neurological: She is alert and oriented to person, place, and time. Coordination normal.     UC Treatments / Results  Labs (all labs ordered are listed, but only abnormal results are displayed) Labs Reviewed - No data to display  EKG None  Radiology No results found.  Procedures Procedures (including critical care time)  Medications Ordered in UC Medications - No data to display  Initial Impression / Assessment and Plan / UC Course  I have reviewed the triage vital signs and the nursing notes.  Pertinent labs & imaging results that were available during my care of the patient were reviewed by me and considered in my medical decision making (see chart for details).     Serous effusion with resultant dizziness.  Will trial  Flonase, Epley maneuver, Final Clinical Impressions(s) / UC Diagnoses    Final diagnoses:  Left chronic serous otitis media   Discharge Instructions   None    ED Prescriptions    Medication Sig Dispense Auth. Provider   sulfamethoxazole-trimethoprim (BACTRIM DS,SEPTRA DS) 800-160 MG tablet Take 1 tablet by mouth 2 (two) times daily for 7 days. 14 tablet Frederica Kuster, MD     Controlled Substance Prescriptions [place] Controlled Substance Registry consulted? No   Frederica Kuster, MD 06/08/17 Windell Moment

## 2020-03-15 DIAGNOSIS — Z1231 Encounter for screening mammogram for malignant neoplasm of breast: Secondary | ICD-10-CM | POA: Diagnosis not present

## 2020-05-04 DIAGNOSIS — J0140 Acute pansinusitis, unspecified: Secondary | ICD-10-CM | POA: Diagnosis not present

## 2020-06-06 DIAGNOSIS — L820 Inflamed seborrheic keratosis: Secondary | ICD-10-CM | POA: Diagnosis not present

## 2020-06-26 DIAGNOSIS — H1032 Unspecified acute conjunctivitis, left eye: Secondary | ICD-10-CM | POA: Diagnosis not present

## 2020-08-20 DIAGNOSIS — M25561 Pain in right knee: Secondary | ICD-10-CM | POA: Diagnosis not present

## 2020-08-20 DIAGNOSIS — M25562 Pain in left knee: Secondary | ICD-10-CM | POA: Diagnosis not present

## 2020-10-20 DIAGNOSIS — Z23 Encounter for immunization: Secondary | ICD-10-CM | POA: Diagnosis not present

## 2020-11-05 DIAGNOSIS — M859 Disorder of bone density and structure, unspecified: Secondary | ICD-10-CM | POA: Diagnosis not present

## 2020-11-05 DIAGNOSIS — I10 Essential (primary) hypertension: Secondary | ICD-10-CM | POA: Diagnosis not present

## 2020-11-05 DIAGNOSIS — E785 Hyperlipidemia, unspecified: Secondary | ICD-10-CM | POA: Diagnosis not present

## 2020-11-12 DIAGNOSIS — R5383 Other fatigue: Secondary | ICD-10-CM | POA: Diagnosis not present

## 2020-11-12 DIAGNOSIS — E039 Hypothyroidism, unspecified: Secondary | ICD-10-CM | POA: Diagnosis not present

## 2020-11-12 DIAGNOSIS — I700 Atherosclerosis of aorta: Secondary | ICD-10-CM | POA: Diagnosis not present

## 2020-11-12 DIAGNOSIS — I10 Essential (primary) hypertension: Secondary | ICD-10-CM | POA: Diagnosis not present

## 2020-11-12 DIAGNOSIS — E785 Hyperlipidemia, unspecified: Secondary | ICD-10-CM | POA: Diagnosis not present

## 2020-11-12 DIAGNOSIS — F3342 Major depressive disorder, recurrent, in full remission: Secondary | ICD-10-CM | POA: Diagnosis not present

## 2020-11-12 DIAGNOSIS — Z0000 Encounter for general adult medical examination without abnormal findings: Secondary | ICD-10-CM | POA: Diagnosis not present

## 2020-11-12 DIAGNOSIS — Z1339 Encounter for screening examination for other mental health and behavioral disorders: Secondary | ICD-10-CM | POA: Diagnosis not present

## 2020-11-12 DIAGNOSIS — Z1331 Encounter for screening for depression: Secondary | ICD-10-CM | POA: Diagnosis not present

## 2020-11-12 DIAGNOSIS — M8580 Other specified disorders of bone density and structure, unspecified site: Secondary | ICD-10-CM | POA: Diagnosis not present

## 2020-11-12 DIAGNOSIS — Z23 Encounter for immunization: Secondary | ICD-10-CM | POA: Diagnosis not present

## 2020-11-12 DIAGNOSIS — R82998 Other abnormal findings in urine: Secondary | ICD-10-CM | POA: Diagnosis not present

## 2020-11-12 DIAGNOSIS — Z8673 Personal history of transient ischemic attack (TIA), and cerebral infarction without residual deficits: Secondary | ICD-10-CM | POA: Diagnosis not present

## 2020-11-26 DIAGNOSIS — M8589 Other specified disorders of bone density and structure, multiple sites: Secondary | ICD-10-CM | POA: Diagnosis not present

## 2020-11-26 DIAGNOSIS — S62308A Unspecified fracture of other metacarpal bone, initial encounter for closed fracture: Secondary | ICD-10-CM | POA: Diagnosis not present

## 2020-12-05 DIAGNOSIS — M25562 Pain in left knee: Secondary | ICD-10-CM | POA: Diagnosis not present

## 2020-12-05 DIAGNOSIS — M25561 Pain in right knee: Secondary | ICD-10-CM | POA: Diagnosis not present

## 2020-12-05 DIAGNOSIS — M170 Bilateral primary osteoarthritis of knee: Secondary | ICD-10-CM | POA: Diagnosis not present

## 2021-02-21 DIAGNOSIS — E039 Hypothyroidism, unspecified: Secondary | ICD-10-CM | POA: Diagnosis not present

## 2021-03-21 DIAGNOSIS — Z1231 Encounter for screening mammogram for malignant neoplasm of breast: Secondary | ICD-10-CM | POA: Diagnosis not present

## 2021-04-14 ENCOUNTER — Encounter (HOSPITAL_COMMUNITY): Payer: Self-pay | Admitting: *Deleted

## 2021-04-14 ENCOUNTER — Ambulatory Visit (HOSPITAL_COMMUNITY)
Admission: EM | Admit: 2021-04-14 | Discharge: 2021-04-14 | Disposition: A | Discharge status: Home / Self Care | Payer: Medicare PPO | Attending: Family Medicine | Admitting: Family Medicine

## 2021-04-14 ENCOUNTER — Other Ambulatory Visit: Payer: Self-pay

## 2021-04-14 ENCOUNTER — Ambulatory Visit (INDEPENDENT_AMBULATORY_CARE_PROVIDER_SITE_OTHER): Payer: Medicare PPO

## 2021-04-14 DIAGNOSIS — J069 Acute upper respiratory infection, unspecified: Secondary | ICD-10-CM | POA: Insufficient documentation

## 2021-04-14 DIAGNOSIS — R059 Cough, unspecified: Secondary | ICD-10-CM | POA: Diagnosis not present

## 2021-04-14 DIAGNOSIS — J309 Allergic rhinitis, unspecified: Secondary | ICD-10-CM | POA: Insufficient documentation

## 2021-04-14 DIAGNOSIS — R051 Acute cough: Secondary | ICD-10-CM | POA: Diagnosis not present

## 2021-04-14 DIAGNOSIS — Z20822 Contact with and (suspected) exposure to covid-19: Secondary | ICD-10-CM | POA: Insufficient documentation

## 2021-04-14 HISTORY — DX: Disorder of thyroid, unspecified: E07.9

## 2021-04-14 MED ORDER — BENZONATATE 100 MG PO CAPS: 100.0000 mg | ORAL_CAPSULE | Freq: Three times a day (TID) | ORAL | 0 refills | Status: AC

## 2021-04-14 MED ORDER — PREDNISONE 20 MG PO TABS: 40.0000 mg | ORAL_TABLET | Freq: Every day | ORAL | 0 refills | Status: AC

## 2021-04-14 NOTE — ED Triage Notes (Signed)
Reports starting with sneezing and light cough onset 5 days ago; states sxs have progressed into worsening cough and chills over past 24 hrs. Denies fevers. ?

## 2021-04-14 NOTE — ED Provider Notes (Signed)
?[facility] ? ? ? ?CSN: [id number] ?Arrival date & time: 04/14/21  1011 ? ? ?  ? ?History   ?Chief Complaint ?Chief Complaint  ?Patient presents with  ? Cough  ? ? ?HPI ?Carolyn Hart is a 81 y.o. female.  ? ?Patient is here for cough, congestion, sneezing x 5 days, worse the last 24 hours.  ?No wheezing or sob, but the cough is worse with mucous.  ?She is having chills, but no fevers.  ?Her sister has bronchitis.  ?She is taking coricidin.  ?Head feels full, has allergies.  ? ?Past Medical History:  ?Diagnosis Date  ? Acute ear infection 04/09/2015  ? Anxiety   ? Chronic bronchitis (HCC)   ? "get it ~ q other year" (04/09/2015)  ? Closed right hand fracture 03/2015  ? due to fall  ? Depression   ? Fall 04/09/2015  ? at doctors office  ? Fibromyalgia   ? Fx metacarpal   ? 03/2015  ? High cholesterol   ? Hypertension   ? Osteopenia   ? Pneumonia ~ 2014  ? Sinus headache   ? Syncope   ? 03/2015  ? Thyroid disease   ? Urinary incontinence   ? Vertigo   ? ? ?Patient Active Problem List  ? Diagnosis Date Noted  ? Left chronic serous otitis media 06/08/2017  ? 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 HLD (hyperlipidemia)   ? Acute CVA (cerebrovascular accident) (HCC) 04/10/2015  ? Closed fracture of 5th metacarpal   ? Essential hypertension   ? Fall   ? Syncope and collapse   ? Syncope 04/09/2015  ? Fx metacarpal 04/09/2015  ? Scalp hematoma 04/09/2015  ? Depression   ? Hypertension   ? Vertigo   ? ? ?Past Surgical History:  ?Procedure Laterality Date  ? BUNIONECTOMY WITH HAMMERTOE RECONSTRUCTION Right   ? SHOULDER OPEN ROTATOR CUFF REPAIR Right   ? TONSILLECTOMY    ? ? ?OB History   ? ? Gravida  ?1  ? Para  ?1  ? Term  ?1  ? Preterm  ?0  ? AB  ?0  ? Living  ?1  ?  ? ? SAB  ?0  ? IAB  ?0  ? Ectopic  ?0  ? Multiple  ?0  ? Live Births  ?1  ?   ?  ?  ? ? ? ?Home Medications   ? ?Prior to Admission medications   ?Medication Sig Start Date End Date Taking?  Authorizing Provider  ?aspirin 81 MG tablet Take 81 mg by mouth daily.   Yes [provider]  ?cholecalciferol (VITAMIN D) 1000 UNITS tablet Take 1,000 Units by mouth daily.   Yes [provider]  ?ezetimibe (ZETIA) 10 MG tablet Take 10 mg by mouth daily.   Yes [provider]  ?LEVOTHYROXINE SODIUM PO Take by mouth.   Yes [provider]  ?Propylene Glycol 0.6 % SOLN Place 1 drop into both eyes daily.    Yes [provider]  ?rosuvastatin (CRESTOR) 10 MG tablet Take 10 mg by mouth daily.    Yes [provider]  ?sertraline (ZOLOFT) 100 MG tablet Take 100 mg by mouth daily.   Yes [provider]  ?UNKNOWN TO PATIENT "Medicine to keep me from going to the bathroom too much"   Yes [provider]  ?loratadine (CLARITIN) 10 MG tablet Take 10 mg by mouth daily.  [provider]  ?Multiple Vitamin (MULTIVITAMIN) tablet Take 1 tablet by mouth daily.    [provider]  ? ? ?Family History ?Family History  ?Problem Relation Age of Onset  ? Heart disease Mother   ? Heart failure Mother   ? Stroke Mother   ? Heart disease Father   ? Heart failure Father   ? Heart disease Brother   ? Heart failure Brother   ? Heart failure Brother   ? ? ?Social History ?Social History  ? ?Tobacco Use  ? Smoking status: Former  ?  Packs/day: 0.25  ?  Years: 10.00  ?  Pack years: 2.50  ?  Types: Cigarettes  ?  Quit date: 01/13/1973  ?  Years since quitting: 48.2  ? Smokeless tobacco: Never  ?Vaping Use  ? Vaping Use: Never used  ?Substance Use Topics  ? Alcohol use: Yes  ?  Alcohol/week: 2.0 standard drinks  ?  Types: 2 Glasses of wine per week  ? Drug use: No  ? ? ? ?Allergies   ?Demeclocycline, Penicillins, Tetracyclines & related, Codeine, and Levaquin [levofloxacin] ? ? ?Review of Systems ?Review of Systems  ?Constitutional: Negative.   ?HENT:  Positive for congestion, rhinorrhea and sinus pressure. Negative for sore throat.   ?Respiratory:  Positive  for cough. Negative for shortness of breath and wheezing.   ?Cardiovascular: Negative.   ?Gastrointestinal: Negative.   ?Genitourinary: Negative.   ? ? ?Physical Exam ?Triage Vital Signs ?ED Triage Vitals  ?Enc Vitals Group  ?   BP 04/14/21 1059 (!) 162/84  ?   Pulse Rate 04/14/21 1059 82  ?   Resp 04/14/21 1059 18  ?   Temp 04/14/21 1059 98 ?F (36.7 ?C)  ?   Temp Source 04/14/21 1059 Oral  ?   SpO2 04/14/21 1059 97 %  ?   Weight --   ?   Height --   ?   Head Circumference --   ?   Peak Flow --   ?   Pain Score 04/14/21 1101 0  ?   Pain Loc --   ?   Pain Edu? --   ?   Excl. in GC? --   ? ?No data found. ? ?Updated Vital Signs ?BP (!) 162/84   Pulse 82   Temp 98 ?F (36.7 ?C) (Oral)   Resp 18   LMP 01/13/2002   SpO2 97%  ? ?Visual Acuity ?Right Eye Distance:   ?Left Eye Distance:   ?Bilateral Distance:   ? ?Right Eye Near:   ?Left Eye Near:    ?Bilateral Near:    ? ?Physical Exam ?Constitutional:   ?   Appearance: Normal appearance.  ?HENT:  ?   Head: Normocephalic and atraumatic.  ?   Nose:  ?   Right Sinus: Maxillary sinus tenderness present.  ?   Left Sinus: Maxillary sinus tenderness present.  ?   Mouth/Throat:  ?   Mouth: Mucous membranes are moist.  ?Cardiovascular:  ?   Rate and Rhythm: Normal rate and regular rhythm.  ?Pulmonary:  ?   Effort: Pulmonary effort is normal.  ?   Breath sounds: Normal breath sounds.  ?Musculoskeletal:  ?   Cervical back: Normal range of motion and neck supple. No tenderness.  ?Lymphadenopathy:  ?   Cervical: No cervical adenopathy.  ?Neurological:  ?   Mental Status: She is alert.  ? ? ? ?UC Treatments / Results  ?Labs ?(all labs ordered are listed, but only  abnormal results are displayed) ?Labs Reviewed - No data to display ? ?EKG ? ? ?Radiology ?DG Chest 2 View ? ?Result Date: 04/14/2021 ?CLINICAL DATA:  Cough EXAM: CHEST - 2 VIEW COMPARISON:  04/09/2015 FINDINGS: The heart size and mediastinal contours are within normal limits. Aortic atherosclerosis. Both lungs are clear.  The visualized skeletal structures are unremarkable. IMPRESSION: No active cardiopulmonary disease. Electronically Signed   By: Duanne Guess D.O.   On: 04/14/2021 12:07   ? ?Procedures ?Procedures (including critical care time) ? ?Medications Ordered in UC ?Medications - No data to display ? ?Initial Impression / Assessment and Plan / UC Course  ?I have reviewed the triage vital signs and the nursing notes. ? ?Pertinent labs & imaging results that were available during my care of the patient were reviewed by me and considered in my medical decision making (see chart for details). ? ?  ?Final Clinical Impressions(s) / UC Diagnoses  ? ?Final diagnoses:  ?Acute cough  ?Upper respiratory tract infection, unspecified type  ?Allergic rhinitis, unspecified seasonality, unspecified trigger  ? ? ? ?Discharge Instructions   ? ?  ?You were seen today for cough and congestion.  ?Your chest xray was normal today.  ?This is likely due to allergies vs viral etiology.  ?The covid swab will be resulted tomorrow.  You may seen this in Mychart, but we will call you if positive.  ?Tyelnol/motrin for any pain/fever.  ?I have sent out a pill to help with the cough, as well as a steroid to treat allergies.   ?You may take over the counter zyrtec daily, in addition to flonase nasal spray to help further with nasal congestion and drainage.  ?Please return if you have worsening cough or shortness of breath for re-evaluation.  ? ? ? ?ED Prescriptions   ? ? Medication Sig Dispense Auth. Provider  ? benzonatate (TESSALON) 100 MG capsule Take 1 capsule (100 mg total) by mouth every 8 (eight) hours. 21 capsule Tyshika Baldridge, MD  ? predniSONE (DELTASONE) 20 MG tablet Take 2 tablets (40 mg total) by mouth daily for 5 days. 10 tablet Jannifer Franklin, MD  ? ?  ? ?PDMP not reviewed this encounter. ?  Jannifer Franklin, MD ?04/14/21 1215 ? ?

## 2021-04-14 NOTE — Discharge Instructions (Signed)
You were seen today for cough and congestion.  ?Your chest xray was normal today.  ?This is likely due to allergies vs viral etiology.  ?The covid swab will be resulted tomorrow.  You may seen this in Mychart, but we will call you if positive.  ?Tyelnol/motrin for any pain/fever.  ?I have sent out a pill to help with the cough, as well as a steroid to treat allergies.   ?You may take over the counter zyrtec daily, in addition to flonase nasal spray to help further with nasal congestion and drainage.  ?Please return if you have worsening cough or shortness of breath for re-evaluation.  ?

## 2021-04-15 LAB — SARS CORONAVIRUS 2 (TAT 6-24 HRS): SARS Coronavirus 2: NEGATIVE

## 2021-04-16 DIAGNOSIS — J40 Bronchitis, not specified as acute or chronic: Secondary | ICD-10-CM | POA: Diagnosis not present

## 2021-04-16 DIAGNOSIS — J069 Acute upper respiratory infection, unspecified: Secondary | ICD-10-CM | POA: Diagnosis not present

## 2021-04-16 DIAGNOSIS — R051 Acute cough: Secondary | ICD-10-CM | POA: Diagnosis not present

## 2021-04-16 DIAGNOSIS — I10 Essential (primary) hypertension: Secondary | ICD-10-CM | POA: Diagnosis not present

## 2021-04-16 DIAGNOSIS — R6883 Chills (without fever): Secondary | ICD-10-CM | POA: Diagnosis not present

## 2021-04-16 DIAGNOSIS — R0981 Nasal congestion: Secondary | ICD-10-CM | POA: Diagnosis not present

## 2021-04-16 DIAGNOSIS — R5383 Other fatigue: Secondary | ICD-10-CM | POA: Diagnosis not present

## 2021-04-16 DIAGNOSIS — Z1152 Encounter for screening for COVID-19: Secondary | ICD-10-CM | POA: Diagnosis not present

## 2021-05-02 DIAGNOSIS — Z1152 Encounter for screening for COVID-19: Secondary | ICD-10-CM | POA: Diagnosis not present

## 2021-05-02 DIAGNOSIS — J40 Bronchitis, not specified as acute or chronic: Secondary | ICD-10-CM | POA: Diagnosis not present

## 2021-05-02 DIAGNOSIS — R0981 Nasal congestion: Secondary | ICD-10-CM | POA: Diagnosis not present

## 2021-05-02 DIAGNOSIS — R509 Fever, unspecified: Secondary | ICD-10-CM | POA: Diagnosis not present

## 2021-05-02 DIAGNOSIS — J069 Acute upper respiratory infection, unspecified: Secondary | ICD-10-CM | POA: Diagnosis not present

## 2021-05-02 DIAGNOSIS — J029 Acute pharyngitis, unspecified: Secondary | ICD-10-CM | POA: Diagnosis not present

## 2021-05-02 DIAGNOSIS — R051 Acute cough: Secondary | ICD-10-CM | POA: Diagnosis not present

## 2021-07-26 DIAGNOSIS — J0101 Acute recurrent maxillary sinusitis: Secondary | ICD-10-CM | POA: Diagnosis not present

## 2021-07-26 DIAGNOSIS — Z1152 Encounter for screening for COVID-19: Secondary | ICD-10-CM | POA: Diagnosis not present

## 2021-07-26 DIAGNOSIS — J309 Allergic rhinitis, unspecified: Secondary | ICD-10-CM | POA: Diagnosis not present

## 2021-08-27 DIAGNOSIS — J3089 Other allergic rhinitis: Secondary | ICD-10-CM | POA: Diagnosis not present

## 2021-08-27 DIAGNOSIS — J300 Vasomotor rhinitis: Secondary | ICD-10-CM | POA: Diagnosis not present

## 2021-08-27 DIAGNOSIS — R052 Subacute cough: Secondary | ICD-10-CM | POA: Diagnosis not present

## 2021-10-03 DIAGNOSIS — R0982 Postnasal drip: Secondary | ICD-10-CM | POA: Diagnosis not present

## 2021-10-16 DIAGNOSIS — R0982 Postnasal drip: Secondary | ICD-10-CM | POA: Diagnosis not present

## 2021-11-09 DIAGNOSIS — Z23 Encounter for immunization: Secondary | ICD-10-CM | POA: Diagnosis not present

## 2021-11-12 DIAGNOSIS — I700 Atherosclerosis of aorta: Secondary | ICD-10-CM | POA: Diagnosis not present

## 2021-11-12 DIAGNOSIS — E785 Hyperlipidemia, unspecified: Secondary | ICD-10-CM | POA: Diagnosis not present

## 2021-11-12 DIAGNOSIS — E039 Hypothyroidism, unspecified: Secondary | ICD-10-CM | POA: Diagnosis not present

## 2021-11-12 DIAGNOSIS — I10 Essential (primary) hypertension: Secondary | ICD-10-CM | POA: Diagnosis not present

## 2021-11-12 DIAGNOSIS — M8580 Other specified disorders of bone density and structure, unspecified site: Secondary | ICD-10-CM | POA: Diagnosis not present

## 2021-11-18 DIAGNOSIS — F3342 Major depressive disorder, recurrent, in full remission: Secondary | ICD-10-CM | POA: Diagnosis not present

## 2021-11-18 DIAGNOSIS — Z1339 Encounter for screening examination for other mental health and behavioral disorders: Secondary | ICD-10-CM | POA: Diagnosis not present

## 2021-11-18 DIAGNOSIS — J300 Vasomotor rhinitis: Secondary | ICD-10-CM | POA: Diagnosis not present

## 2021-11-18 DIAGNOSIS — I10 Essential (primary) hypertension: Secondary | ICD-10-CM | POA: Diagnosis not present

## 2021-11-18 DIAGNOSIS — Z1331 Encounter for screening for depression: Secondary | ICD-10-CM | POA: Diagnosis not present

## 2021-11-18 DIAGNOSIS — Z8673 Personal history of transient ischemic attack (TIA), and cerebral infarction without residual deficits: Secondary | ICD-10-CM | POA: Diagnosis not present

## 2021-11-18 DIAGNOSIS — R5383 Other fatigue: Secondary | ICD-10-CM | POA: Diagnosis not present

## 2021-11-18 DIAGNOSIS — Z0000 Encounter for general adult medical examination without abnormal findings: Secondary | ICD-10-CM | POA: Diagnosis not present

## 2021-11-18 DIAGNOSIS — I700 Atherosclerosis of aorta: Secondary | ICD-10-CM | POA: Diagnosis not present

## 2021-11-18 DIAGNOSIS — M8580 Other specified disorders of bone density and structure, unspecified site: Secondary | ICD-10-CM | POA: Diagnosis not present

## 2021-11-18 DIAGNOSIS — E785 Hyperlipidemia, unspecified: Secondary | ICD-10-CM | POA: Diagnosis not present

## 2021-11-18 DIAGNOSIS — R82998 Other abnormal findings in urine: Secondary | ICD-10-CM | POA: Diagnosis not present

## 2022-02-26 DIAGNOSIS — H40013 Open angle with borderline findings, low risk, bilateral: Secondary | ICD-10-CM | POA: Diagnosis not present

## 2022-02-26 DIAGNOSIS — H04123 Dry eye syndrome of bilateral lacrimal glands: Secondary | ICD-10-CM | POA: Diagnosis not present

## 2022-03-27 DIAGNOSIS — Z1231 Encounter for screening mammogram for malignant neoplasm of breast: Secondary | ICD-10-CM | POA: Diagnosis not present

## 2022-05-22 IMAGING — DX DG CHEST 2V
2 series · 2 of 2 positions shown · non-contrast
Comparison: 04/09/2015

CLINICAL DATA: Cough

EXAM:
CHEST - 2 VIEW

[chest pa]
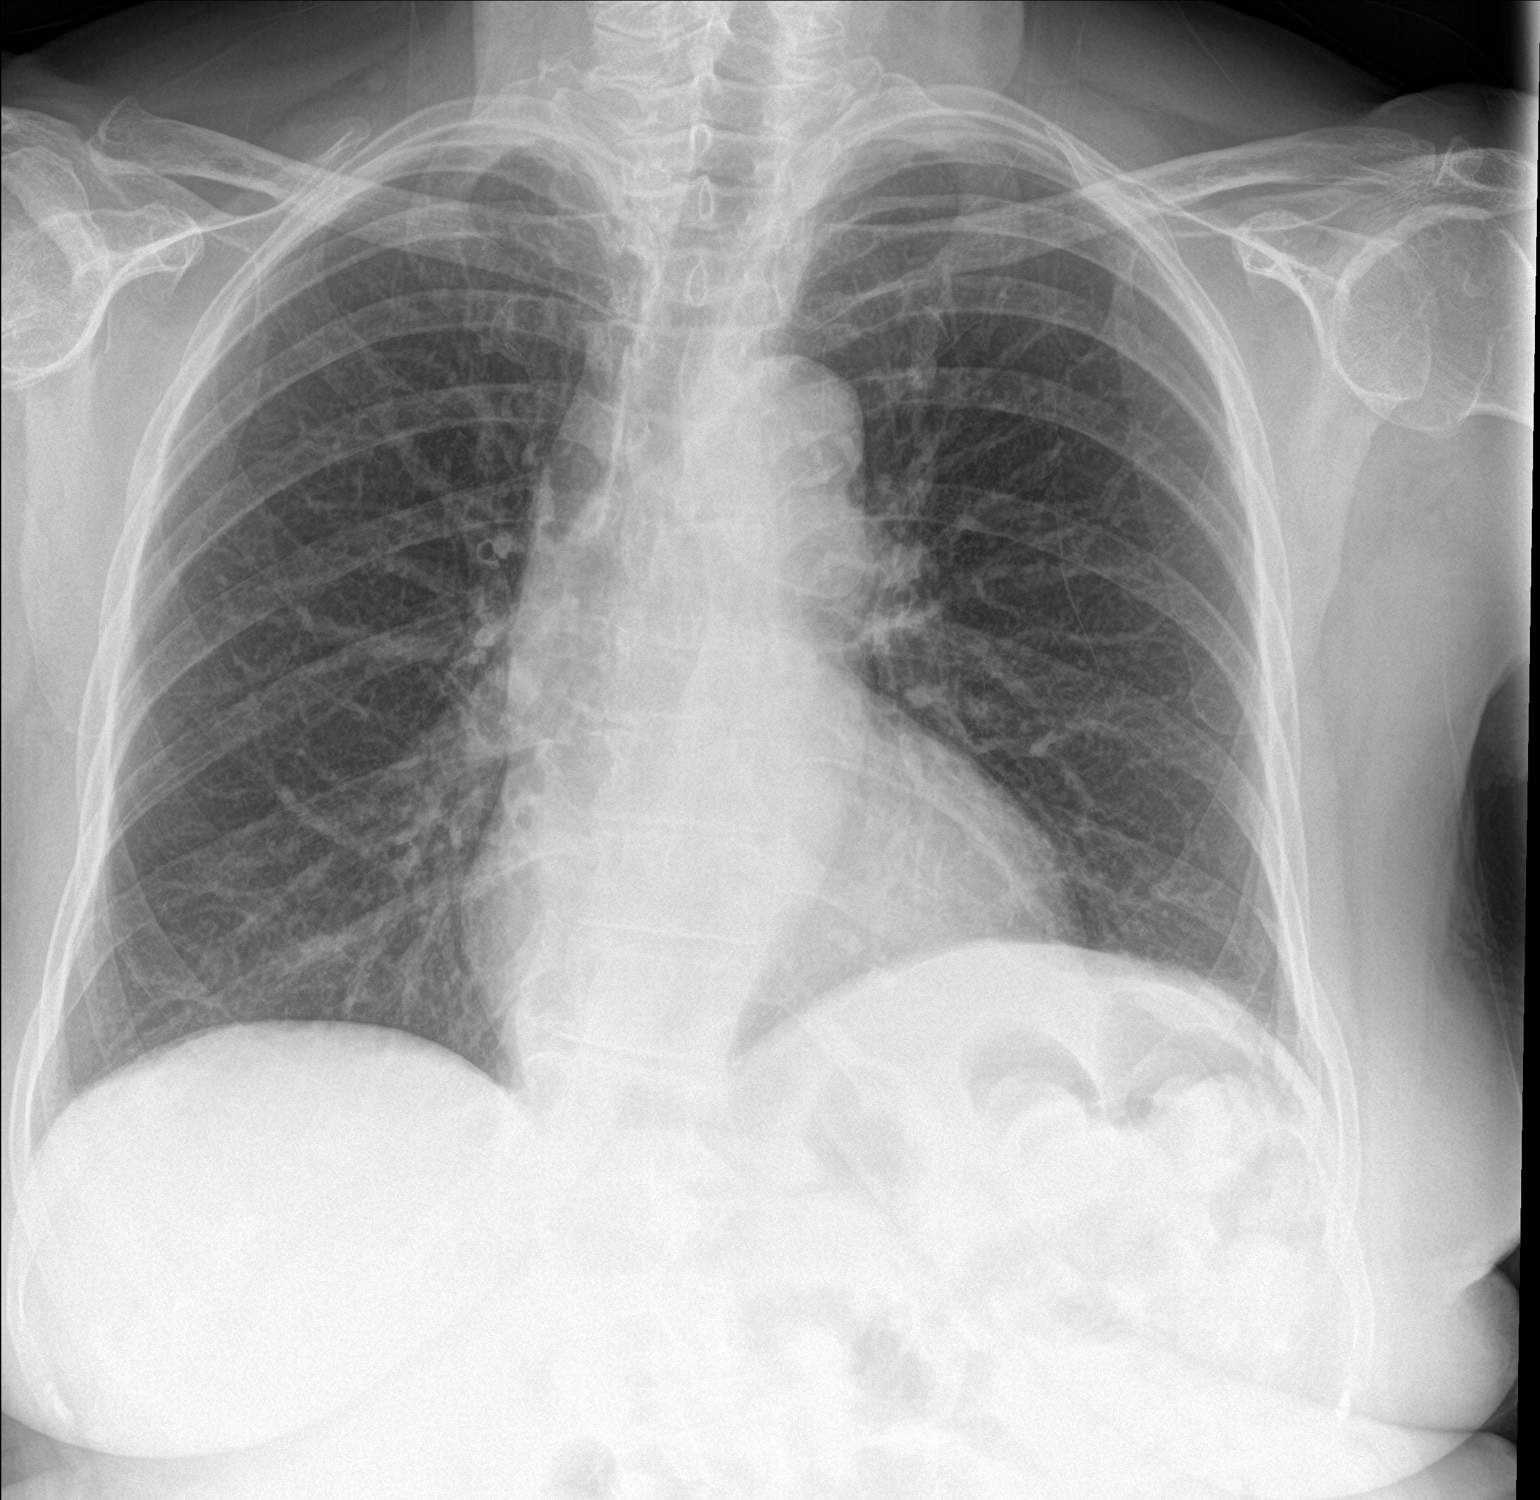

[chest lat]
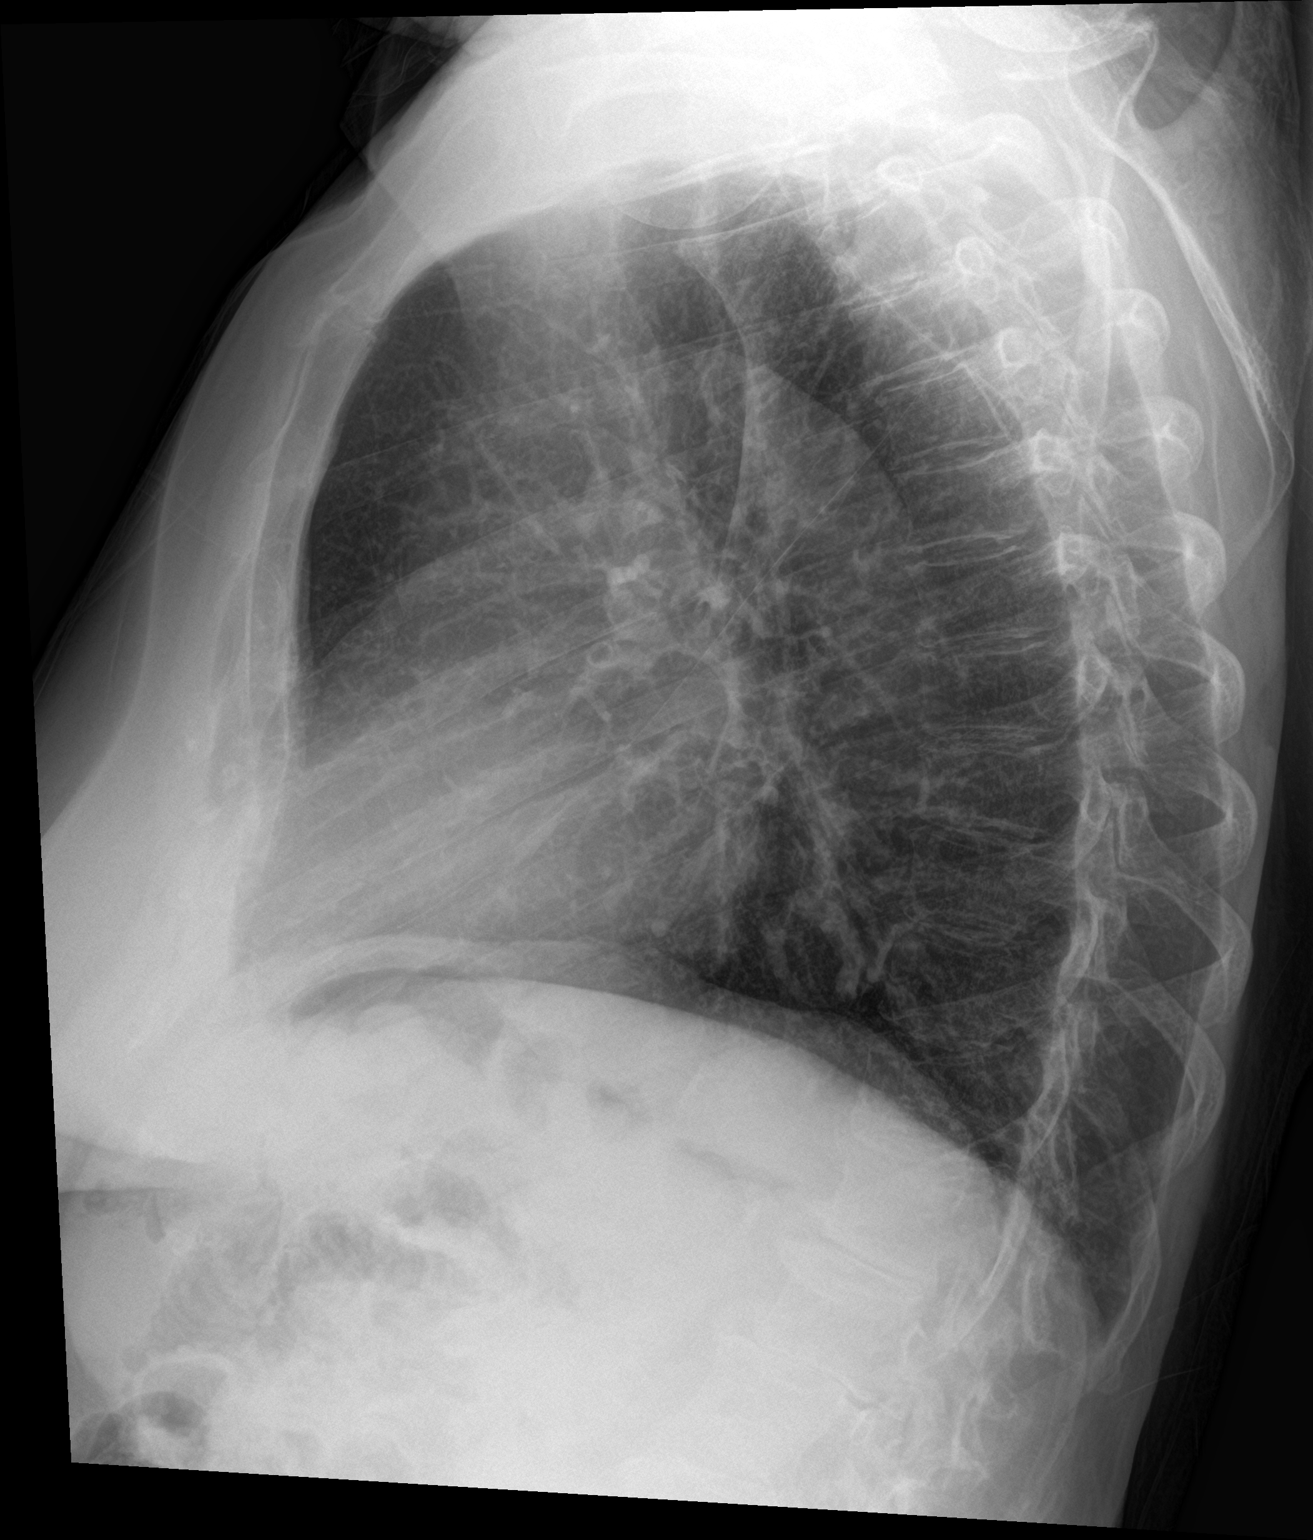

[2 of 2 positions shown; findings below may reference images not displayed]

FINDINGS: The heart size and mediastinal contours are within normal limits.
Aortic atherosclerosis. Both lungs are clear. The visualized
skeletal structures are unremarkable.
IMPRESSION: No active cardiopulmonary disease.

## 2022-08-06 DIAGNOSIS — J309 Allergic rhinitis, unspecified: Secondary | ICD-10-CM | POA: Diagnosis not present

## 2022-08-06 DIAGNOSIS — J329 Chronic sinusitis, unspecified: Secondary | ICD-10-CM | POA: Diagnosis not present

## 2022-08-27 DIAGNOSIS — M25561 Pain in right knee: Secondary | ICD-10-CM | POA: Diagnosis not present

## 2022-08-27 DIAGNOSIS — M25562 Pain in left knee: Secondary | ICD-10-CM | POA: Diagnosis not present

## 2022-09-05 DIAGNOSIS — M25561 Pain in right knee: Secondary | ICD-10-CM | POA: Diagnosis not present

## 2022-09-12 DIAGNOSIS — M25562 Pain in left knee: Secondary | ICD-10-CM | POA: Diagnosis not present

## 2022-09-12 DIAGNOSIS — M25561 Pain in right knee: Secondary | ICD-10-CM | POA: Diagnosis not present

## 2022-09-19 DIAGNOSIS — M25562 Pain in left knee: Secondary | ICD-10-CM | POA: Diagnosis not present

## 2022-10-03 DIAGNOSIS — M25562 Pain in left knee: Secondary | ICD-10-CM | POA: Diagnosis not present

## 2022-10-03 DIAGNOSIS — M25561 Pain in right knee: Secondary | ICD-10-CM | POA: Diagnosis not present

## 2022-10-09 DIAGNOSIS — S83242D Other tear of medial meniscus, current injury, left knee, subsequent encounter: Secondary | ICD-10-CM | POA: Diagnosis not present

## 2022-10-23 DIAGNOSIS — S83242D Other tear of medial meniscus, current injury, left knee, subsequent encounter: Secondary | ICD-10-CM | POA: Diagnosis not present

## 2022-10-27 DIAGNOSIS — S83242D Other tear of medial meniscus, current injury, left knee, subsequent encounter: Secondary | ICD-10-CM | POA: Diagnosis not present

## 2022-10-31 DIAGNOSIS — S83242D Other tear of medial meniscus, current injury, left knee, subsequent encounter: Secondary | ICD-10-CM | POA: Diagnosis not present

## 2022-11-03 DIAGNOSIS — S83242D Other tear of medial meniscus, current injury, left knee, subsequent encounter: Secondary | ICD-10-CM | POA: Diagnosis not present

## 2022-11-12 DIAGNOSIS — S83242D Other tear of medial meniscus, current injury, left knee, subsequent encounter: Secondary | ICD-10-CM | POA: Diagnosis not present

## 2022-11-19 DIAGNOSIS — S83242D Other tear of medial meniscus, current injury, left knee, subsequent encounter: Secondary | ICD-10-CM | POA: Diagnosis not present

## 2022-11-20 DIAGNOSIS — J0100 Acute maxillary sinusitis, unspecified: Secondary | ICD-10-CM | POA: Diagnosis not present

## 2022-11-20 DIAGNOSIS — I10 Essential (primary) hypertension: Secondary | ICD-10-CM | POA: Diagnosis not present

## 2022-11-20 DIAGNOSIS — R058 Other specified cough: Secondary | ICD-10-CM | POA: Diagnosis not present

## 2022-12-15 DIAGNOSIS — M8580 Other specified disorders of bone density and structure, unspecified site: Secondary | ICD-10-CM | POA: Diagnosis not present

## 2022-12-15 DIAGNOSIS — I10 Essential (primary) hypertension: Secondary | ICD-10-CM | POA: Diagnosis not present

## 2022-12-15 DIAGNOSIS — E785 Hyperlipidemia, unspecified: Secondary | ICD-10-CM | POA: Diagnosis not present

## 2022-12-22 DIAGNOSIS — Z1339 Encounter for screening examination for other mental health and behavioral disorders: Secondary | ICD-10-CM | POA: Diagnosis not present

## 2022-12-22 DIAGNOSIS — Z0000 Encounter for general adult medical examination without abnormal findings: Secondary | ICD-10-CM | POA: Diagnosis not present

## 2022-12-22 DIAGNOSIS — I10 Essential (primary) hypertension: Secondary | ICD-10-CM | POA: Diagnosis not present

## 2022-12-22 DIAGNOSIS — Z23 Encounter for immunization: Secondary | ICD-10-CM | POA: Diagnosis not present

## 2022-12-22 DIAGNOSIS — Z860101 Personal history of adenomatous and serrated colon polyps: Secondary | ICD-10-CM | POA: Diagnosis not present

## 2022-12-22 DIAGNOSIS — J300 Vasomotor rhinitis: Secondary | ICD-10-CM | POA: Diagnosis not present

## 2022-12-22 DIAGNOSIS — Z8673 Personal history of transient ischemic attack (TIA), and cerebral infarction without residual deficits: Secondary | ICD-10-CM | POA: Diagnosis not present

## 2022-12-22 DIAGNOSIS — Z8249 Family history of ischemic heart disease and other diseases of the circulatory system: Secondary | ICD-10-CM | POA: Diagnosis not present

## 2022-12-22 DIAGNOSIS — M8580 Other specified disorders of bone density and structure, unspecified site: Secondary | ICD-10-CM | POA: Diagnosis not present

## 2022-12-22 DIAGNOSIS — E785 Hyperlipidemia, unspecified: Secondary | ICD-10-CM | POA: Diagnosis not present

## 2022-12-22 DIAGNOSIS — R5383 Other fatigue: Secondary | ICD-10-CM | POA: Diagnosis not present

## 2022-12-22 DIAGNOSIS — F3342 Major depressive disorder, recurrent, in full remission: Secondary | ICD-10-CM | POA: Diagnosis not present

## 2022-12-22 DIAGNOSIS — I700 Atherosclerosis of aorta: Secondary | ICD-10-CM | POA: Diagnosis not present

## 2022-12-22 DIAGNOSIS — Z1331 Encounter for screening for depression: Secondary | ICD-10-CM | POA: Diagnosis not present

## 2022-12-22 DIAGNOSIS — R82998 Other abnormal findings in urine: Secondary | ICD-10-CM | POA: Diagnosis not present

## 2022-12-22 DIAGNOSIS — F17201 Nicotine dependence, unspecified, in remission: Secondary | ICD-10-CM | POA: Diagnosis not present

## 2023-03-26 DIAGNOSIS — R058 Other specified cough: Secondary | ICD-10-CM | POA: Diagnosis not present

## 2023-03-26 DIAGNOSIS — J329 Chronic sinusitis, unspecified: Secondary | ICD-10-CM | POA: Diagnosis not present

## 2023-03-26 DIAGNOSIS — J3489 Other specified disorders of nose and nasal sinuses: Secondary | ICD-10-CM | POA: Diagnosis not present

## 2023-04-02 DIAGNOSIS — Z1231 Encounter for screening mammogram for malignant neoplasm of breast: Secondary | ICD-10-CM | POA: Diagnosis not present

## 2023-05-07 DIAGNOSIS — R053 Chronic cough: Secondary | ICD-10-CM | POA: Diagnosis not present

## 2023-05-07 DIAGNOSIS — R058 Other specified cough: Secondary | ICD-10-CM | POA: Diagnosis not present

## 2023-05-07 DIAGNOSIS — J300 Vasomotor rhinitis: Secondary | ICD-10-CM | POA: Diagnosis not present

## 2023-06-09 DIAGNOSIS — R0981 Nasal congestion: Secondary | ICD-10-CM | POA: Diagnosis not present

## 2023-06-09 DIAGNOSIS — J069 Acute upper respiratory infection, unspecified: Secondary | ICD-10-CM | POA: Diagnosis not present

## 2023-06-09 DIAGNOSIS — J029 Acute pharyngitis, unspecified: Secondary | ICD-10-CM | POA: Diagnosis not present

## 2023-06-09 DIAGNOSIS — R062 Wheezing: Secondary | ICD-10-CM | POA: Diagnosis not present

## 2023-06-09 DIAGNOSIS — R5383 Other fatigue: Secondary | ICD-10-CM | POA: Diagnosis not present

## 2023-06-09 DIAGNOSIS — J3489 Other specified disorders of nose and nasal sinuses: Secondary | ICD-10-CM | POA: Diagnosis not present

## 2023-06-09 DIAGNOSIS — Z1152 Encounter for screening for COVID-19: Secondary | ICD-10-CM | POA: Diagnosis not present

## 2023-06-09 DIAGNOSIS — R051 Acute cough: Secondary | ICD-10-CM | POA: Diagnosis not present

## 2023-06-22 DIAGNOSIS — R053 Chronic cough: Secondary | ICD-10-CM | POA: Diagnosis not present

## 2023-06-22 DIAGNOSIS — J4540 Moderate persistent asthma, uncomplicated: Secondary | ICD-10-CM | POA: Diagnosis not present

## 2023-06-22 DIAGNOSIS — R058 Other specified cough: Secondary | ICD-10-CM | POA: Diagnosis not present

## 2023-07-20 DIAGNOSIS — J4540 Moderate persistent asthma, uncomplicated: Secondary | ICD-10-CM | POA: Diagnosis not present

## 2023-07-20 DIAGNOSIS — R058 Other specified cough: Secondary | ICD-10-CM | POA: Diagnosis not present

## 2023-07-20 DIAGNOSIS — R053 Chronic cough: Secondary | ICD-10-CM | POA: Diagnosis not present

## 2023-08-10 ENCOUNTER — Other Ambulatory Visit: Payer: Self-pay | Admitting: Medical Genetics

## 2023-09-09 DIAGNOSIS — H40013 Open angle with borderline findings, low risk, bilateral: Secondary | ICD-10-CM | POA: Diagnosis not present

## 2023-09-10 ENCOUNTER — Emergency Department (HOSPITAL_BASED_OUTPATIENT_CLINIC_OR_DEPARTMENT_OTHER)
Admission: EM | Admit: 2023-09-10 | Discharge: 2023-09-10 | Disposition: A | Discharge status: Home / Self Care | Attending: Emergency Medicine | Admitting: Emergency Medicine

## 2023-09-10 ENCOUNTER — Encounter (HOSPITAL_BASED_OUTPATIENT_CLINIC_OR_DEPARTMENT_OTHER): Payer: Self-pay | Admitting: Emergency Medicine

## 2023-09-10 ENCOUNTER — Emergency Department (HOSPITAL_BASED_OUTPATIENT_CLINIC_OR_DEPARTMENT_OTHER)

## 2023-09-10 ENCOUNTER — Other Ambulatory Visit: Payer: Self-pay

## 2023-09-10 DIAGNOSIS — S80212A Abrasion, left knee, initial encounter: Secondary | ICD-10-CM | POA: Diagnosis not present

## 2023-09-10 DIAGNOSIS — W19XXXA Unspecified fall, initial encounter: Secondary | ICD-10-CM

## 2023-09-10 DIAGNOSIS — S80211A Abrasion, right knee, initial encounter: Secondary | ICD-10-CM | POA: Insufficient documentation

## 2023-09-10 DIAGNOSIS — Z7982 Long term (current) use of aspirin: Secondary | ICD-10-CM | POA: Insufficient documentation

## 2023-09-10 DIAGNOSIS — S0181XA Laceration without foreign body of other part of head, initial encounter: Secondary | ICD-10-CM | POA: Diagnosis not present

## 2023-09-10 DIAGNOSIS — S0990XA Unspecified injury of head, initial encounter: Secondary | ICD-10-CM | POA: Diagnosis present

## 2023-09-10 DIAGNOSIS — W010XXA Fall on same level from slipping, tripping and stumbling without subsequent striking against object, initial encounter: Secondary | ICD-10-CM | POA: Diagnosis not present

## 2023-09-10 HISTORY — DX: Unspecified asthma, uncomplicated: J45.909

## 2023-09-10 HISTORY — DX: Gastro-esophageal reflux disease without esophagitis: K21.9

## 2023-09-10 MED ORDER — LIDOCAINE-EPINEPHRINE (PF) 2 %-1:200000 IJ SOLN
10.0000 mL | Freq: Once | INTRAMUSCULAR | Status: AC
  Administered 2023-09-10: 10 mL
  Filled 2023-09-10: qty 20

## 2023-09-10 NOTE — ED Triage Notes (Signed)
 Taking afternoon walk, Tripped over raised part of sidewalk Lac above right eye Right knee pain Denies LOC Some dizziness  81mg  asa nightly

## 2023-09-10 NOTE — Discharge Instructions (Addendum)
 Keep your wound clean and apply bacitracin ointment twice a day. Stitches can be removed in 7 days. Return if signs of infection such as increasing redness or drainage.  Despite the fact there was not large swelling at the time of your injury, you may see bruising of your forehead after a day or so.  Bruising that occurs around the forehead or eye will often resolve by tracking down the face.  You may see purpleish greenish or bluish discoloration move down the side of your face from your area of injury. Review head injury instructions return if any problems or concerns.

## 2023-09-10 NOTE — ED Provider Notes (Signed)
 [facility] AT [facility] Provider Note   CSN: [id number] Arrival date & time: 09/10/23  1918     Patient presents with: [place]   Carolyn Hart is a 83 y.o. female.   HPI Patient was walking outside the [employer].  She reports that she thinks she tripped on a small curb.  She fell forward and landed on her knees and her face.  Patient reports that she got a area of laceration and bleeding to the forehead.  She did not have loss of consciousness.  She denies significant headache.  No nausea no vomiting.  No confusion.  No neck pain.  No weakness numbness or tingling of extremities.  Patient did skinned her knees but she was able to stand and ambulate.  Denies severe pain to the knees.    Prior to Admission medications   Medication Sig Start Date End Date Taking? Authorizing Provider  aspirin  81 MG tablet Take 81 mg by mouth daily.    [provider]  benzonatate  (TESSALON ) 100 MG capsule Take 1 capsule (100 mg total) by mouth every 8 (eight) hours. 04/14/21   Piontek, Rocky, MD  cholecalciferol  (VITAMIN D ) 1000 UNITS tablet Take 1,000 Units by mouth daily.    [provider]  ezetimibe (ZETIA) 10 MG tablet Take 10 mg by mouth daily.    [provider]  LEVOTHYROXINE SODIUM PO Take by mouth.    [provider]  Propylene Glycol 0.6 % SOLN Place 1 drop into both eyes daily.     [provider]  rosuvastatin  (CRESTOR ) 10 MG tablet Take 10 mg by mouth daily.     [provider]  sertraline  (ZOLOFT ) 100 MG tablet Take 100 mg by mouth daily.    [provider]  UNKNOWN TO PATIENT Medicine to keep me from going to the bathroom too much    [provider]    Allergies: Demeclocycline, Penicillins, Tetracyclines & related, Codeine, and Levaquin  [levofloxacin ]    Review of Systems  Updated Vital Signs BP (!) 141/103   Pulse 82   Temp 97.9 F (36.6 C) (Oral)   Resp 20   LMP 01/13/2002    SpO2 99%   Physical Exam Constitutional:      Comments: Alert with clear mental status.  GCS 15.  No respiratory distress.  HENT:     Head:     Comments: Patient has a 3 cm laceration to the right forehead above the brow.  Slight gaping.  Small trickle of blood.  No pulsatile bleeding or brisk bleeding.  No significant surrounding hematoma.    Nose: Nose normal.     Mouth/Throat:     Mouth: Mucous membranes are moist.     Pharynx: Oropharynx is clear.  Eyes:     Extraocular Movements: Extraocular movements intact.     Pupils: Pupils are equal, round, and reactive to light.  Neck:     Comments: No midline C-spine tenderness Cardiovascular:     Rate and Rhythm: Normal rate and regular rhythm.  Pulmonary:     Effort: Pulmonary effort is normal.     Breath sounds: Normal breath sounds.  Chest:     Chest wall: No tenderness.  Abdominal:     General: There is no distension.     Palpations: Abdomen is soft.  Musculoskeletal:        General: Normal range of motion.     Comments: Superficial abrasions to the knees.  No deformity and  no effusion.  Intact range of motion.  Skin:    General: Skin is warm and dry.  Neurological:     General: No focal deficit present.     Mental Status: She is oriented to person, place, and time.     Motor: No weakness.     Coordination: Coordination normal.  Psychiatric:        Mood and Affect: Mood normal.     (all labs ordered are listed, but only abnormal results are displayed) Labs Reviewed - No data to display  EKG: None  Radiology: CT Head Wo Contrast Result Date: 09/10/2023 CLINICAL DATA:  Recent trip and fall with facial laceration, initial encounter EXAM: CT HEAD WITHOUT CONTRAST TECHNIQUE: Contiguous axial images were obtained from the base of the skull through the vertex without intravenous contrast. RADIATION DOSE REDUCTION: This exam was performed according to the departmental dose-optimization program which includes automated  exposure control, adjustment of the mA and/or kV according to patient size and/or use of iterative reconstruction technique. COMPARISON:  04/09/2015 FINDINGS: Brain: No evidence of acute infarction, hemorrhage, hydrocephalus, extra-axial collection or mass lesion/mass effect. Mild atrophic changes and chronic white matter ischemic changes are noted. Vascular: No hyperdense vessel or unexpected calcification. Skull: Normal. Negative for fracture or focal lesion. Sinuses/Orbits: No acute finding. Other: None. IMPRESSION: Chronic changes without acute abnormality Electronically Signed   By: Oneil Devonshire M.D.   On: 09/10/2023 20:00     .Laceration Repair  Date/Time: 09/10/2023 8:36 PM  Performed by: Armenta Canning, MD Authorized by: Armenta Canning, MD   Consent:    Consent obtained:  Verbal   Consent given by:  Patient   Risks discussed:  Infection, pain and poor wound healing Anesthesia:    Anesthesia method:  Local infiltration   Local anesthetic:  Lidocaine  2% WITH epi Laceration details:    Location:  Face   Face location:  Forehead   Length (cm):  3   Depth (mm):  4 Pre-procedure details:    Preparation:  Patient was prepped and draped in usual sterile fashion Exploration:    Limited defect created (wound extended): no     Hemostasis achieved with:  Direct pressure   Wound exploration: entire depth of wound visualized     Wound extent: areolar tissue violated     Contaminated: no   Treatment:    Area cleansed with:  Shur-Clens   Amount of cleaning:  Standard Skin repair:    Repair method:  Sutures   Suture size:  5-0   Suture material:  Prolene   Suture technique:  Simple interrupted   Number of sutures:  6 Approximation:    Approximation:  Close Repair type:    Repair type:  Simple Post-procedure details:    Dressing:  Antibiotic ointment    Medications Ordered in the ED  lidocaine -EPINEPHrine  (XYLOCAINE  W/EPI) 2 %-1:200000 (PF) injection 10 mL (10 mLs Infiltration  Given 09/10/23 2008)                                    Medical Decision Making Amount and/or Complexity of Data Reviewed Radiology: ordered.  Risk Prescription drug management.   Patient presents as outlined with a mechanical fall.  She does have a laceration to the forehead.  Patient has risk for possible intracranial injury at 61 with significant head trauma.  Will get CT head imaging.  CT head interpreted by radiology no acute  findings.  Patient has forehead laceration requiring repair.  Repair per procedure note.  Good alignment.  Wound is clean and intact.  Home care with sutures and return precautions reviewed.  Patient discharged in good condition alert and well in appearance with family members.     Final diagnoses:  Fall, initial encounter  Facial laceration, initial encounter    ED Discharge Orders     None          Armenta Canning, MD 09/10/23 2038

## 2023-09-17 ENCOUNTER — Other Ambulatory Visit (INDEPENDENT_AMBULATORY_CARE_PROVIDER_SITE_OTHER): Payer: Self-pay

## 2023-09-17 ENCOUNTER — Ambulatory Visit (INDEPENDENT_AMBULATORY_CARE_PROVIDER_SITE_OTHER): Admitting: Orthopedic Surgery

## 2023-09-17 DIAGNOSIS — R058 Other specified cough: Secondary | ICD-10-CM | POA: Diagnosis not present

## 2023-09-17 DIAGNOSIS — M25562 Pain in left knee: Secondary | ICD-10-CM | POA: Diagnosis not present

## 2023-09-17 DIAGNOSIS — R053 Chronic cough: Secondary | ICD-10-CM | POA: Diagnosis not present

## 2023-09-17 DIAGNOSIS — M170 Bilateral primary osteoarthritis of knee: Secondary | ICD-10-CM | POA: Diagnosis not present

## 2023-09-17 DIAGNOSIS — M25561 Pain in right knee: Secondary | ICD-10-CM | POA: Diagnosis not present

## 2023-09-17 DIAGNOSIS — J4530 Mild persistent asthma, uncomplicated: Secondary | ICD-10-CM | POA: Diagnosis not present

## 2023-09-19 ENCOUNTER — Encounter: Payer: Self-pay | Admitting: Orthopedic Surgery

## 2023-09-19 MED ORDER — LIDOCAINE HCL 1 % IJ SOLN
5.0000 mL | INTRAMUSCULAR | Status: AC | PRN
  Administered 2023-09-17: 5 mL

## 2023-09-19 MED ORDER — TRIAMCINOLONE ACETONIDE 40 MG/ML IJ SUSP
40.0000 mg | INTRAMUSCULAR | Status: AC | PRN
  Administered 2023-09-17: 40 mg via INTRA_ARTICULAR

## 2023-09-19 MED ORDER — BUPIVACAINE HCL 0.25 % IJ SOLN
4.0000 mL | INTRAMUSCULAR | Status: AC | PRN
  Administered 2023-09-17: 4 mL via INTRA_ARTICULAR

## 2023-09-19 NOTE — Progress Notes (Signed)
 Office Visit Note   Patient: Carolyn Hart           Date of Birth: February 08, 1940           MRN: [id number] Visit Date: 09/17/2023 Requested by: Loreli Elsie JONETTA Mickey., MD [address] [place],  [place] [address] PCP: Loreli Elsie JONETTA Mickey., MD  Subjective: Chief Complaint  Patient presents with   Right Knee - Pain   Left Knee - Pain    HPI: Carolyn Hart is a 83 y.o. female who presents to the office reporting right greater than left knee pain.  Ongoing for several years but worse since she had a fall last week where she tripped over concrete.  Overall the acute pain from that event has gotten much better but the baseline amount of pain she is having more on the right-hand side than the left-hand side has gradually worsened.  She did have a patella fracture last year.  Also has a history of TIA.  She loves to walk for exercise about 30 minutes 5 times a week.  She is a retired Occupational psychologist.  She has never had injections in her knees.  She lives on a 1 level ranch house.  Uses no assistive devices.  She wants to be able to participate in family vacation activities without restriction..                ROS: All systems reviewed are negative as they relate to the chief complaint within the history of present illness.  Patient denies fevers or chills.  Assessment & Plan: Visit Diagnoses:  1. Pain in both knees, unspecified chronicity     Plan: Impression is mild bilateral knee arthritis.  Bilateral knee injections performed today.  We will preapproved her for gel as well.  In general she is having some knee pain but there is no effusion today and she has very good range of motion.  Mild degenerative changes present.  I think we can improve her overall baseline amount of knee pain with episodic alternating cortisone and gel shots.  She will come back for gel injection in both knees when the shot starts to wear off.  Anticipate that will be may be 6 to 8 weeks from now. This patient is  diagnosed with osteoarthritis of the knee(s).    Radiographs show evidence of joint space narrowing, osteophytes, subchondral sclerosis and/or subchondral cysts.  This patient has knee pain which interferes with functional and activities of daily living.    This patient has experienced inadequate response, adverse effects and/or intolerance with conservative treatments such as acetaminophen , NSAIDS, topical creams, physical therapy or regular exercise, knee bracing and/or weight loss.   This patient has experienced inadequate response or has a contraindication to intra articular steroid injections for at least 3 months.   This patient is not scheduled to have a total knee replacement within 6 months of starting treatment with viscosupplementation.   Follow-Up Instructions: Return in about 6 weeks (around 10/29/2023).   Orders:  Orders Placed This Encounter  Procedures   XR Knee 1-2 Views Left   XR Knee 1-2 Views Right   Ambulatory request for injection medication   No orders of the defined types were placed in this encounter.     Procedures: Large Joint Inj: bilateral knee on 09/17/2023 8:00 AM Indications: diagnostic evaluation, joint swelling and pain Details: 18 G 1.5 in needle, superolateral approach  Arthrogram: No  Medications (Right): 5 mL lidocaine  1 %;  4 mL bupivacaine  0.25 %; 40 mg triamcinolone  acetonide 40 MG/ML Medications (Left): 5 mL lidocaine  1 %; 4 mL bupivacaine  0.25 %; 40 mg triamcinolone  acetonide 40 MG/ML Outcome: tolerated well, no immediate complications Procedure, treatment alternatives, risks and benefits explained, specific risks discussed. Consent was given by the patient. Immediately prior to procedure a time out was called to verify the correct patient, procedure, equipment, support staff and site/side marked as required. Patient was prepped and draped in the usual sterile fashion.     This patient is diagnosed with osteoarthritis of the knee(s).     Radiographs show evidence of joint space narrowing, osteophytes, subchondral sclerosis and/or subchondral cysts.  This patient has knee pain which interferes with functional and activities of daily living.    This patient has experienced inadequate response, adverse effects and/or intolerance with conservative treatments such as acetaminophen , NSAIDS, topical creams, physical therapy or regular exercise, knee bracing and/or weight loss.   This patient has experienced inadequate response or has a contraindication to intra articular steroid injections for at least 3 months.   This patient is not scheduled to have a total knee replacement within 6 months of starting treatment with viscosupplementation.   Clinical Data: No additional findings.  Objective: Vital Signs: LMP 01/13/2002   Physical Exam:  Constitutional: Patient appears well-developed HEENT:  Head: Normocephalic Eyes:EOM are normal Neck: Normal range of motion Cardiovascular: Normal rate Pulmonary/chest: Effort normal Neurologic: Patient is alert Skin: Skin is warm Psychiatric: Patient has normal mood and affect  Ortho Exam: Ortho exam demonstrates near full range of motion in both knees.  No effusion in either knee.  Mild abrasions more on the right knee than the left knee.  Collateral and cruciate ligaments are stable.  Extensor mechanism intact.  No masses lymphadenopathy or skin changes noted in either knee region.  Pedal pulses palpable.  No groin pain with internal or external rotation of either leg.  Minimal patellofemoral crepitus is present.  Specialty Comments:  No specialty comments available.  Imaging: No results found.   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) 05/16/2015   HLD (hyperlipidemia)    Acute CVA (cerebrovascular accident) (HCC)  04/10/2015   Closed fracture of 5th metacarpal    Essential hypertension    Fall    Syncope and collapse    Syncope 04/09/2015   Fx metacarpal 04/09/2015   Scalp hematoma 04/09/2015   Depression    Hypertension    Vertigo    Past Medical History:  Diagnosis Date   Acute ear infection 04/09/2015   Anxiety    Asthma    Chronic bronchitis (HCC)    get it ~ q other year (04/09/2015)   Closed right hand fracture 03/2015   due to fall   Depression    Fall 04/09/2015   at doctors office   Fibromyalgia    Fx metacarpal    03/2015   GERD (gastroesophageal reflux disease)    High cholesterol    Hypertension    Osteopenia    Pneumonia ~ 2014   Sinus headache    Syncope    03/2015   Thyroid  disease    Urinary incontinence    Vertigo     Family History  Problem Relation Age of Onset   Heart disease Mother    Heart failure Mother    Stroke Mother  Heart disease Father    Heart failure Father    Heart disease Brother    Heart failure Brother    Heart failure Brother     Past Surgical History:  Procedure Laterality Date   BUNIONECTOMY WITH HAMMERTOE RECONSTRUCTION Right    SHOULDER OPEN ROTATOR CUFF REPAIR Right    TONSILLECTOMY     Social History   Occupational History   Not on file  Tobacco Use   Smoking status: Former    Current packs/day: 0.00    Average packs/day: 0.3 packs/day for 10.0 years (2.5 ttl pk-yrs)    Types: Cigarettes    Start date: 01/14/1963    Quit date: 01/13/1973    Years since quitting: 50.7   Smokeless tobacco: Never  Vaping Use   Vaping status: Never Used  Substance and Sexual Activity   Alcohol  use: Yes    Alcohol /week: 2.0 standard drinks of alcohol     Types: 2 Glasses of wine per week   Drug use: No   Sexual activity: Not on file

## 2023-09-21 DIAGNOSIS — S0990XA Unspecified injury of head, initial encounter: Secondary | ICD-10-CM | POA: Diagnosis not present

## 2023-09-21 DIAGNOSIS — Z9181 History of falling: Secondary | ICD-10-CM | POA: Diagnosis not present

## 2023-09-21 DIAGNOSIS — I10 Essential (primary) hypertension: Secondary | ICD-10-CM | POA: Diagnosis not present

## 2023-09-21 DIAGNOSIS — S01111A Laceration without foreign body of right eyelid and periocular area, initial encounter: Secondary | ICD-10-CM | POA: Diagnosis not present

## 2023-09-21 DIAGNOSIS — Z4802 Encounter for removal of sutures: Secondary | ICD-10-CM | POA: Diagnosis not present

## 2023-10-17 DIAGNOSIS — Z23 Encounter for immunization: Secondary | ICD-10-CM | POA: Diagnosis not present

## 2023-11-03 ENCOUNTER — Other Ambulatory Visit: Payer: Self-pay | Admitting: Medical Genetics

## 2023-11-03 DIAGNOSIS — Z006 Encounter for examination for normal comparison and control in clinical research program: Secondary | ICD-10-CM

## 2023-11-16 ENCOUNTER — Encounter: Payer: Self-pay | Admitting: Radiology

## 2023-12-23 LAB — GENECONNECT MOLECULAR SCREEN: Genetic Analysis Overall Interpretation: NEGATIVE

## 2024-01-11 ENCOUNTER — Encounter (HOSPITAL_BASED_OUTPATIENT_CLINIC_OR_DEPARTMENT_OTHER): Payer: Self-pay | Admitting: Emergency Medicine

## 2024-01-11 ENCOUNTER — Emergency Department (HOSPITAL_BASED_OUTPATIENT_CLINIC_OR_DEPARTMENT_OTHER): Admitting: Radiology

## 2024-01-11 ENCOUNTER — Emergency Department (HOSPITAL_BASED_OUTPATIENT_CLINIC_OR_DEPARTMENT_OTHER)

## 2024-01-11 ENCOUNTER — Emergency Department (HOSPITAL_BASED_OUTPATIENT_CLINIC_OR_DEPARTMENT_OTHER)
Admission: EM | Admit: 2024-01-11 | Discharge: 2024-01-12 | Disposition: A | Discharge status: Home / Self Care | Attending: Emergency Medicine | Admitting: Emergency Medicine

## 2024-01-11 ENCOUNTER — Other Ambulatory Visit: Payer: Self-pay

## 2024-01-11 DIAGNOSIS — Z79899 Other long term (current) drug therapy: Secondary | ICD-10-CM | POA: Diagnosis not present

## 2024-01-11 DIAGNOSIS — S161XXA Strain of muscle, fascia and tendon at neck level, initial encounter: Secondary | ICD-10-CM | POA: Insufficient documentation

## 2024-01-11 DIAGNOSIS — S46811A Strain of other muscles, fascia and tendons at shoulder and upper arm level, right arm, initial encounter: Secondary | ICD-10-CM | POA: Diagnosis not present

## 2024-01-11 DIAGNOSIS — Z7982 Long term (current) use of aspirin: Secondary | ICD-10-CM | POA: Insufficient documentation

## 2024-01-11 DIAGNOSIS — I10 Essential (primary) hypertension: Secondary | ICD-10-CM | POA: Insufficient documentation

## 2024-01-11 DIAGNOSIS — Y92410 Unspecified street and highway as the place of occurrence of the external cause: Secondary | ICD-10-CM | POA: Insufficient documentation

## 2024-01-11 DIAGNOSIS — M542 Cervicalgia: Secondary | ICD-10-CM | POA: Diagnosis present

## 2024-01-11 DIAGNOSIS — S0990XA Unspecified injury of head, initial encounter: Secondary | ICD-10-CM | POA: Diagnosis not present
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
